# Patient Record
Sex: Male | Born: 1970 | Race: White | Hispanic: No | Marital: Married | Smoking: Former smoker
Health system: Southern US, Community
[De-identification: ages and names within clinical notes are randomized; demographics above are authoritative.]

## PROBLEM LIST (undated history)

## (undated) DIAGNOSIS — Z8042 Family history of malignant neoplasm of prostate: Secondary | ICD-10-CM

## (undated) DIAGNOSIS — Z860100 Personal history of colon polyps, unspecified: Secondary | ICD-10-CM

## (undated) DIAGNOSIS — K4090 Unilateral inguinal hernia, without obstruction or gangrene, not specified as recurrent: Secondary | ICD-10-CM

## (undated) DIAGNOSIS — J301 Allergic rhinitis due to pollen: Secondary | ICD-10-CM

## (undated) DIAGNOSIS — Z8051 Family history of malignant neoplasm of kidney: Secondary | ICD-10-CM

## (undated) DIAGNOSIS — Z808 Family history of malignant neoplasm of other organs or systems: Secondary | ICD-10-CM

## (undated) DIAGNOSIS — T7840XA Allergy, unspecified, initial encounter: Secondary | ICD-10-CM

## (undated) DIAGNOSIS — Z800 Family history of malignant neoplasm of digestive organs: Secondary | ICD-10-CM

## (undated) DIAGNOSIS — T8859XA Other complications of anesthesia, initial encounter: Secondary | ICD-10-CM

## (undated) DIAGNOSIS — C6290 Malignant neoplasm of unspecified testis, unspecified whether descended or undescended: Secondary | ICD-10-CM

## (undated) DIAGNOSIS — C800 Disseminated malignant neoplasm, unspecified: Secondary | ICD-10-CM

## (undated) DIAGNOSIS — C439 Malignant melanoma of skin, unspecified: Secondary | ICD-10-CM

## (undated) HISTORY — PX: MOHS SURGERY: SUR867

## (undated) HISTORY — PX: INGUINAL HERNIA REPAIR: SUR1180

## (undated) HISTORY — DX: Malignant neoplasm of unspecified testis, unspecified whether descended or undescended: C62.90

## (undated) HISTORY — DX: Family history of malignant neoplasm of digestive organs: Z80.0

## (undated) HISTORY — DX: Malignant melanoma of skin, unspecified: C43.9

## (undated) HISTORY — DX: Family history of malignant neoplasm of prostate: Z80.42

## (undated) HISTORY — DX: Disseminated malignant neoplasm, unspecified: C80.0

## (undated) HISTORY — DX: Allergic rhinitis due to pollen: J30.1

## (undated) HISTORY — DX: Allergy, unspecified, initial encounter: T78.40XA

## (undated) HISTORY — PX: MELANOMA EXCISION: SHX5266

## (undated) HISTORY — DX: Family history of malignant neoplasm of kidney: Z80.51

## (undated) HISTORY — DX: Family history of malignant neoplasm of other organs or systems: Z80.8

## (undated) HISTORY — PX: SURGERY SCROTAL / TESTICULAR: SUR1316

---
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
## 2001-03-31 DIAGNOSIS — C6290 Malignant neoplasm of unspecified testis, unspecified whether descended or undescended: Secondary | ICD-10-CM

## 2001-03-31 HISTORY — DX: Malignant neoplasm of unspecified testis, unspecified whether descended or undescended: C62.90

## 2001-03-31 HISTORY — PX: ORCHIECTOMY: SHX2116

## 2010-01-24 NOTE — Patient Instructions (Signed)
Sciatica: After Your Visit  Your Care Instructions  Sciatica (say "sye-AT-ih-kuh") is an irritation of one of the sciatic nerves, which come from the spinal cord in the lower back. The sciatic nerves and their branches extend down through the buttock to the foot. Sciatica can develop when an injured disc in the back presses against a spinal nerve root. Its main symptom is pain, numbness, or weakness that is often worse in the leg or foot than in the back.  Sciatica often will improve and go away with time. Early treatment usually includes medicines and exercises to relieve pain.  Follow-up care is a key part of your treatment and safety. Be sure to make and go to all appointments, and call your doctor if you are having problems. It???s also a good idea to know your test results and keep a list of the medicines you take.  How can you care for yourself at home?  ?? Take pain medicines exactly as directed.   ?? If the doctor gave you a prescription medicine for pain, take it as prescribed.   ?? If you are not taking a prescription pain medicine, ask your doctor if you can take an over-the-counter medicine.  ?? Put ice or a cold pack on the middle of your lower back for 10 to 20 minutes several times each day. Put a thin cloth between the ice and your skin.   ?? After the first 2 or 3 days, use a warm pack or heating pad for 20 minutes at a time. Hot showers will also help. Hot baths may help as long as you can lie or sit in a position that does not stress your back. You may also keep using ice if it helps.   ?? Avoid sitting if possible, unless it feels better than standing.   ?? Alternate lying down with short walks. Increase your walking distance as you are able to without making your symptoms worse.   ?? Do not do anything that makes your symptoms worse.  When should you call for help?  Call 911 anytime you think you may need emergency care. For example, call if:  ?? You have sudden weakness or numbness in both legs.    ?? You lose bowel or bladder control.   ?? You suddenly cannot walk or stand.  Call your doctor now or seek immediate medical care if:  ?? You have weakness in your ankle or leg.   ?? You have new pain, numbness, tingling, or weakness, especially in the buttocks, genital or rectal area, legs, or feet.   ?? You have symptoms of a urinary infection. For example:   ?? You have blood or pus in your urine.   ?? You have pain in your back just below your rib cage. This is called flank pain.   ?? You have a fever, chills, or body aches.   ?? It hurts to urinate.   ?? You have groin or belly pain.  Watch closely for changes in your health, and be sure to contact your doctor if:  ?? Your back pain gets worse or more frequent.   ?? Your back pain is not getting better after 1 week of home treatment. It may take a lot longer for the pain to go away completely, but it should feel at least a little better.     Where can you learn more?   Go to http://www.healthwise.net/[facility]  Enter Z239 in the search box to learn more about "  Sciatica: After Your Visit."   ?? 2006-2011 [employer]. Care instructions adapted under license by [employer] (which disclaims liability or warranty for this information). This care instruction is for use with your licensed healthcare professional. If you have questions about a medical condition or this instruction, always ask your healthcare professional. Healthwise, Incorporated disclaims any warranty or liability for your use of this information.  Content Version: 9.0.56994; Last Revised: July 06, 2009

## 2010-01-24 NOTE — Progress Notes (Signed)
HISTORY OF PRESENT ILLNESS  Kevin Riley is a 39 y.o. male.  HPI    new to my practice        Problem visit:  Kevin Riley is here for complaint of R lower buttock to posterior thigh.  Problem began 4 month(s) ago.  Severity is moderate  Character of problem: worse with standing or sitting for long periods.  running makes the problem worse.  aleve makes the problem better.  Associated symptoms include: none  Treatments tried include: Aleve used and beneficial    Recent full labs with work wellness [facility].  All look normal.    Patient Active Problem List   Diagnoses Code   ??? Testicular cancer 186.61F       Past Surgical History   Procedure Date   ??? Hx orchiectomy      Right   ??? Hx appendectomy    ??? Hx hernia repair      inguinal right       History   Social History   ??? Marital Status: Married     Spouse Name: N/A     Number of Children: N/A   ??? Years of Education: N/A   Occupational History   ??? Not on file.   Social History Main Topics   ??? Smoking status: Former Smoker -- 0.5 packs/day for 8 years     Quit date: 01/24/2002   ??? Smokeless tobacco: Never Used   ??? Alcohol Use: 4.0 oz/week     8 Cans of beer per week   ??? Drug Use: No   ??? Sexually Active: Yes -- Male partner(s)   Other Topics Concern   ??? Not on file   Social History Narrative   ??? No narrative on file       Family History   Problem Relation Age of Onset   ??? COPD Mother    ??? Colon Polyps Father    ??? Hypertension Father    ??? High Cholesterol Father    ??? Cancer Paternal Grandmother 57     colon   ??? Diabetes Neg Hx        Current outpatient prescriptions   Medication Sig   ??? NAPROXEN SODIUM (ALEVE PO) Take  by mouth.   ??? omega-3 fatty acids-vitamin e (FISH OIL) 1,000 mg Cap Take 1 Cap by mouth.       No Known Allergies      Review of Systems   Constitutional: Negative for weight loss and malaise/fatigue.   HENT: Negative for sore throat.    Eyes: Negative for blurred vision and pain.   Respiratory: Negative for cough, shortness of breath and wheezing.     Cardiovascular: Negative for chest pain, palpitations and leg swelling.   Gastrointestinal: Negative for heartburn, nausea, vomiting, diarrhea and constipation.   Genitourinary: Positive for urgency (has had urgency for years since chemo.  multiple negative workups) and frequency. Negative for dysuria and hematuria.   Musculoskeletal: Negative for myalgias, back pain and joint pain.   Skin: Negative for rash.   Neurological: Negative for dizziness and headaches.   Psychiatric/Behavioral: Negative for depression. The patient is not nervous/anxious.        Physical Exam   Nursing note and vitals reviewed.  Constitutional: He appears well-developed and well-nourished. No distress.        BP 126/75   Pulse 46   Temp 114.8 ??F (46 ??C)   Ht 5' 11.5" (1.816 m)   Wt 195 lb 9.6 oz (  88.724 kg)   BMI 26.90 kg/m2Body mass index is 26.90 kg/(m^2).   HENT:   Mouth/Throat: Oropharynx is clear and moist.   Neck: No JVD present. Carotid bruit is not present.   Cardiovascular: Normal rate, regular rhythm, normal heart sounds and intact distal pulses.    Pulmonary/Chest: Effort normal and breath sounds normal.   Musculoskeletal: He exhibits no edema.        Lumbar back: He exhibits normal range of motion, no tenderness, no swelling, no pain and no spasm.        straight leg-raise negative on R   Neurological: He is alert.   Skin: Skin is warm and dry. He is not diaphoretic.        Multiple regular pigmented nevi, cherry hemangiomas, skin tags, pink papules - over trunk and face and UE's..  None suspicitous.  He believes some are enlarging.       ASSESSMENT and PLAN  Aubert was seen today for establish care and hip pain.    Diagnoses and associated orders for this visit:    Sciatica  - NAPROXEN SODIUM (ALEVE PO); Take  by mouth.  The patient was given written instructions detailing his diagnoses and recommendations made today at the visit.    Testicular cancer - follow up with oncology yearly.    Skin lesions, generalized   - REFERRAL TO DERMATOLOGY    Other Orders  - omega-3 fatty acids-vitamin e (FISH OIL) 1,000 mg Cap; Take 1 Cap by mouth.  - ascorbic acid (VITAMIN C) 1,000 mg tablet; Take  by mouth.  - MULTIVITAMIN/IRON/FOLIC ACID (CENTRUM COMPLETE PO); Take  by mouth.        Follow-up Disposition:  Return if symptoms worsen or fail to improve.  lab results and schedule of future lab studies reviewed with patient  reviewed medications and side effects in detail

## 2011-05-08 NOTE — Progress Notes (Signed)
Pt received Tdap today in the office. Tolerated well and recorded in pt's chart.

## 2011-05-08 NOTE — Progress Notes (Signed)
Dermatologist/early 2012/skin check/christine rausch
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
## 2012-03-31 DIAGNOSIS — C439 Malignant melanoma of skin, unspecified: Secondary | ICD-10-CM

## 2012-03-31 HISTORY — DX: Malignant melanoma of skin, unspecified: C43.9

## 2012-06-08 NOTE — Progress Notes (Signed)
HISTORY OF PRESENT ILLNESS  Kevin Riley is a 42 y.o. male.  HPI  Problem visit:  Kevin Riley is here for complaint of acute medial arch pain.  Problem began 1 week(s) ago.  Severity is marked  Character of problem: occurred while running in hard soled shoes  Standing/ walking makes the problem worse.  Rest, nonwt bearing makes the problem better.  Associated symptoms include: no swelling,  Has bruised under arch  Treatments tried include: Aleve used and beneficial      ROS    Physical Exam   Musculoskeletal:        Right ankle: Normal.        Feet:    Tender with area of ecchymosis where depicted.  No swelling or warmth.  Walking with limp.   BP 150/83   Pulse 65   Temp(Src) 98 ??F (36.7 ??C) (Oral)   Resp 22   Ht 5' 11.5" (1.816 m)   Wt 201 lb 9.6 oz (91.445 kg)   BMI 27.73 kg/m2   SpO2 100%      ASSESSMENT and PLAN  Kevin Riley was seen today for foot pain.    Diagnoses and associated orders for this visit:    Foot pain, right - ? Stress fracture vs soft tissue tear/injury.  Start with xray.  Podiatry consult.  - XR FOOT RT MIN 3 V; Future  - REFERRAL TO PODIATRY  Continue aleve.        Follow-up Disposition:  Return if symptoms worsen or fail to improve.  reviewed medications and side effects in detail  radiology results and schedule of future radiology studies reviewed with patient

## 2012-06-09 NOTE — Telephone Encounter (Signed)
Results are in pt's chart for foot xray. Will send message to pcp for review and advisement.

## 2012-06-09 NOTE — Telephone Encounter (Signed)
Please call pt regarding his xray results on his mobile. Thanks

## 2012-06-10 NOTE — Telephone Encounter (Signed)
xrays were normal and sent to him thru mychart system yesterday soon after study.  Please let him know.  Please continue with the plans we discussed at your office visit.

## 2012-06-10 NOTE — Telephone Encounter (Signed)
Spoke to pt who was made aware to call with any questions or concerns.

## 2013-06-02 LAB — AMB POC RAPID INFLUENZA TEST: QuickVue Influenza test: NEGATIVE

## 2013-06-02 MED ORDER — OSELTAMIVIR PHOSPHATE 75 MG CAP
75 mg | ORAL_CAPSULE | Freq: Two times a day (BID) | ORAL | Status: AC
Start: 2013-06-02 — End: 2013-06-07

## 2013-06-02 MED ORDER — CODEINE-GUAIFENESIN 10 MG-100 MG/5 ML ORAL LIQUID
100-10 mg/5 mL | Freq: Three times a day (TID) | ORAL | Status: AC | PRN
Start: 2013-06-02 — End: ?

## 2013-06-02 NOTE — Patient Instructions (Signed)
Influenza (Flu): After Your Visit  Your Care Instructions  Influenza (flu) is an infection in the lungs and breathing passages. It is caused by the influenza virus. There are different strains, or types, of the flu virus from year to year. Unlike the common cold, the flu comes on suddenly and the symptoms, such as a cough, congestion, fever, chills, fatigue, aches, and pains, are more severe. These symptoms may last up to 10 days. Although the flu can make you feel very sick, it usually doesn't cause serious health problems.  Home treatment is usually all you need for flu symptoms. But your doctor may prescribe antiviral medicine to prevent other health problems, such as pneumonia, from developing. Older people and those who have a long-term health condition, such as lung disease, are most at risk for having pneumonia or other health problems.  Follow-up care is a key part of your treatment and safety. Be sure to make and go to all appointments, and call your doctor if you are having problems. It???s also a good idea to know your test results and keep a list of the medicines you take.  How can you care for yourself at home?  ?? Get plenty of rest.  ?? Drink plenty of fluids, enough so that your urine is light yellow or clear like water. If you have kidney, heart, or liver disease and have to limit fluids, talk with your doctor before you increase the amount of fluids you drink.  ?? Take an over-the-counter pain medicine if needed, such as acetaminophen (Tylenol), ibuprofen (Advil, Motrin), or naproxen (Aleve), to relieve fever, headache, and muscle aches. Read and follow all instructions on the label. No one younger than 20 should take aspirin. It has been linked to Reye syndrome, a serious illness.  ?? Do not smoke. Smoking can make the flu worse. If you need help quitting, talk to your doctor about stop-smoking programs and medicines. These can increase your chances of quitting for good.  ?? Breathe moist air from a  hot shower or from a sink filled with hot water to help clear a stuffy nose.  ?? Before you use cough and cold medicines, check the label. These medicines may not be safe for young children or for people with certain health problems.  ?? If the skin around your nose and lips becomes sore, put some petroleum jelly on the area.  ?? To ease coughing:  ?? Drink fluids to soothe a scratchy throat.  ?? Suck on cough drops or plain hard candy.  ?? Take an over-the-counter cough medicine that contains dextromethorphan to help you get some sleep. Read and follow all instructions on the label.  ?? Raise your head at night with an extra pillow. This may help you rest if coughing keeps you awake.  ?? Take any prescribed medicine exactly as directed. Call your doctor if you think you are having a problem with your medicine.  To avoid spreading the flu  ?? Wash your hands regularly, and keep your hands away from your face.  ?? Stay home from school, work, and other public places until you are feeling better and your fever has been gone for at least 24 hours. The fever needs to have gone away on its own without the help of medicine.  ?? Ask people living with you to talk to their doctors about preventing the flu. They may get antiviral medicine to keep from getting the flu from you.  ?? To prevent the flu in   the future, get a flu vaccine every fall. Encourage people living with you to get the vaccine.  ?? Cover your mouth when you cough or sneeze.  When should you call for help?  Call 911 anytime you think you may need emergency care. For example, call if:  ?? You have severe trouble breathing.  Call your doctor now or seek immediate medical care if:  ?? You have new or worse trouble breathing.  ?? You seem to be getting much sicker.  ?? You feel very sleepy or confused.  ?? You have a new or higher fever.  ?? You get a new rash.  Watch closely for changes in your health, and be sure to contact your doctor if:  ?? You begin to get better and then get  worse.  ?? You are not getting better after 1 week.   Where can you learn more?   Go to http://www.healthwise.net/[facility]  Enter L652 in the search box to learn more about "Influenza (Flu): After Your Visit."   ?? 2006-2014 Healthwise, Inc[employer]. Care instructions adapted under license by [place] (which disclaims liability or warranty for this information). This care instruction is for use with your licensed healthcare professional. If you have questions about a medical condition or this instruction, always ask your healthcare professional. Healthwise, Incorporated disclaims any warranty or liability for your use of this information.  Content Version: 10.2.346038; Current as of: November 04, 2011

## 2013-06-02 NOTE — Progress Notes (Signed)
Kevin Riley is a 43 y.o. male who complains of flu-like illness for 2 days    Symptoms include dry cough for 2 days, now congestion, sneezing, myalgias, headache, fever and chills for 1 day Symptoms have been worsening since onset.  he denies diarrhea  Denies a history of chest pain, nausea, shortness of breath, vomiting and wheezing.  Evaluation to date: none.   Treatment to date: OTC products.   reports that he quit smoking about 11 years ago. He has never used smokeless tobacco.  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  Worked w him all day 2-3 days ago    Physical Exam   Nursing note and vitals reviewed.   Blood pressure 126/80, pulse 58, temperature 100.2 ??F (37.9 ??C), resp. rate 12, height 5' 11.5" (1.816 m), weight 163 lb 12.8 oz (74.299 kg), SpO2 98 %.  Constitutional:  Pt is oriented. Appears fatigued, not diaphoretic. No distress  Right Ear: Tympanic membrane and ear canal normal.   Left Ear: Tympanic membrane and ear canal normal.   Nose: Mucosal edema and rhinorrhea present.   Mouth/Throat: Mucous membranes w mild erythema. No oropharyngeal exudate or posterior oropharyngeal erythema.   Eyes: Right eye exhibits no discharge. Left eye exhibits no discharge.   Cardiovascular: Normal rate and regular rhythm. No murmur heard.   Pulmonary/Chest: No stridor. No wheezing   Abdominal: Bowel sounds are normal. No tenderness.   Musculoskeletal: She exhibits no edema.   Lymphadenopathy:   Head (right side): No submandibular and no preauricular adenopathy present.   Head (left side): No submandibular and no preauricular adenopathy present.   She has no cervical adenopathy.   Neurological: She is alert and oriented.   Skin: No rash noted. She is not diaphoretic.   Psychiatric: She has a normal mood and affect. Thought content normal.    Influenza- presumed.  Test negative, but exposure to flu and s/s  No evidence of  pneumonia.     Timing of illness and/or concomitant medical conditions do warrant Tamiflu therapy per CDC guidelines.     Seek medical care if s/s worsen after 3-4 days or if develop chest pain, shortness of breath, confusion.     - Rest as much as possible and stay home from work/school              - Wash hand frequently and cough/sneeze into your sleeve to help prevent       infection of others   - Drink plenty of fluids   - Ibuprofen (Advil, Motrin) 400-800mg  every 6 hours or Aleve 220 mg every                   8 hours for fever, headache, pain   - Tylenol extra strength 500 mg every 6 hours for pain, headache, fever   - Nasal saline rinses 2-3 times daily for nasal congestion   - Mucinex DM, Robitussin DM or Delsym for cough   - Cepacol throat losenges and saline gargles (1 tsp salt in 8 oz water) for sore       throat   - Tea with honey for cough              - Benadryl (diphenhydramine) 50 mg at night for nasal or allergic symptoms   - Pseudophedrine 12-hour tablets twice daily for nasal and inner ear congestion.     - Afrin (  oxymetazoline) nasal spray 2 sprays in each nostril twice daily for severe      congestion.  Do not use this medication for more than 3 days as it may cause      "rebound congestion".

## 2015-09-06 DIAGNOSIS — D1801 Hemangioma of skin and subcutaneous tissue: Secondary | ICD-10-CM | POA: Diagnosis not present

## 2015-09-06 DIAGNOSIS — D235 Other benign neoplasm of skin of trunk: Secondary | ICD-10-CM | POA: Diagnosis not present

## 2015-09-06 DIAGNOSIS — L821 Other seborrheic keratosis: Secondary | ICD-10-CM | POA: Diagnosis not present

## 2015-09-06 DIAGNOSIS — L814 Other melanin hyperpigmentation: Secondary | ICD-10-CM | POA: Diagnosis not present

## 2015-10-04 ENCOUNTER — Ambulatory Visit (INDEPENDENT_AMBULATORY_CARE_PROVIDER_SITE_OTHER): Payer: BLUE CROSS/BLUE SHIELD | Admitting: Family Medicine

## 2015-10-04 ENCOUNTER — Encounter: Payer: Self-pay | Admitting: Family Medicine

## 2015-10-04 VITALS — BP 126/82 | HR 63 | Temp 97.8°F | Resp 16 | Ht 70.5 in | Wt 198.0 lb

## 2015-10-04 DIAGNOSIS — Z85820 Personal history of malignant melanoma of skin: Secondary | ICD-10-CM | POA: Diagnosis not present

## 2015-10-04 DIAGNOSIS — Z8547 Personal history of malignant neoplasm of testis: Secondary | ICD-10-CM

## 2015-10-04 DIAGNOSIS — Z8042 Family history of malignant neoplasm of prostate: Secondary | ICD-10-CM

## 2015-10-04 DIAGNOSIS — M7651 Patellar tendinitis, right knee: Secondary | ICD-10-CM | POA: Diagnosis not present

## 2015-10-04 NOTE — Progress Notes (Signed)
Office Note 10/04/2015  CC:  Chief Complaint  Patient presents with  . Establish Care  . Knee Pain    right    HPI:  Jay Ward is a 45 y.o. White male who is here to establish care and discuss knee pain. Patient's most recent primary MD: [facility], in [place], [place]. Old records were reviewed prior to or during today's visit.  Pt is an avid runner. Running outside more lately vs treadmill. Ran 10 miles one morning (he usually runs 3-5) and he had lots of knee cap pain and patellar tendon pain in R knee.  He took time off and it felt better but then he ran again and it came back.  So he rested again for longer and now recently started running 3-5 miles again and it is feeling fine now.  The knee did not swell, lock up, or buckle.    Has FH of colon ca: paternal aunt and paternal GM. Prostate ca in father in his 70s--pt describes it as "slow growing" and no treatment is being done.  Past Medical History  Diagnosis Date  . Testicular cancer [facility]) 2003    orchiectomy + chemo--he has been released from f/u by urology due to long term remission status.  . Melanoma ([place]) 2014    on his back.  He sees Derm q 6 mo.  . Family history of prostate cancer in father     Past Surgical History  Procedure Laterality Date  . Orchiectomy Left 2003  . Appendectomy  1990    Family History  Problem Relation Age of Onset  . Prostate cancer Father   . Hyperlipidemia Father   . Hypertension Father   . Colon cancer Paternal Aunt     dx in early 32's  . Heart disease Maternal Grandfather   . Stroke Maternal Grandfather   . Colon cancer Paternal Grandmother     Dx in her 15's    Social History   Social History  . Marital Status: Married    Spouse Name: N/A  . Number of Children: N/A  . Years of Education: N/A   Occupational History  . Not on file.   Social History Main Topics  . Smoking status: Former Smoker -- 0.25 packs/day  for 10 years    Types: Cigarettes    Quit date: 03/31/2001  . Smokeless tobacco: Never Used  . Alcohol Use: 3.6 oz/week    6 Cans of beer per week  . Drug Use: No  . Sexual Activity: Not on file   Other Topics Concern  . Not on file   Social History Narrative   Married, no children.   Orig from [place].   Educ: BS   Occup: Banking   Minimal tob use in the past--quit 2003.   Alc: rare.    MEDS: none  No Known Allergies  ROS Review of Systems  Constitutional: Negative for fever, chills, appetite change and fatigue.  HENT: Negative for congestion, dental problem, ear pain and sore throat.   Eyes: Negative for discharge, redness and visual disturbance.  Respiratory: Negative for cough, chest tightness, shortness of breath and wheezing.   Cardiovascular: Negative for chest pain, palpitations and leg swelling.  Gastrointestinal: Negative for nausea, vomiting, abdominal pain, diarrhea and blood in stool.  Genitourinary: Negative for dysuria, urgency, frequency, hematuria, flank pain and difficulty urinating.  Musculoskeletal: Negative for myalgias, back pain, joint swelling, arthralgias and neck stiffness.  Skin: Negative for pallor and rash.  Neurological: Negative for dizziness, speech difficulty, weakness and headaches.  Hematological: Negative for adenopathy. Does not bruise/bleed easily.  Psychiatric/Behavioral: Negative for confusion and sleep disturbance. The patient is not nervous/anxious.     PE; Blood pressure 126/82, pulse 63, temperature 97.8 F (36.6 C), temperature source Oral, resp. rate 16, height 5' 10.5" (1.791 m), weight 198 lb (89.812 kg), SpO2 97 %. Gen: Alert, well appearing.  Patient is oriented to person, place, time, and situation. AFFECT: pleasant, lucid thought and speech. [id number]: no injection, icteris, swelling, or exudate.  EOMI, PERRLA. Mouth: lips without lesion/swelling.  Oral mucosa pink and moist. Oropharynx without erythema, exudate, or  swelling.  Neck - No masses or thyromegaly or limitation in range of motion CV: RRR, no m/r/g.   LUNGS: CTA bilat, nonlabored resps, good aeration in all lung fields. EXT: no clubbing, cyanosis, or edema.  Right knee: full ROM without pain or stiffness or locking/clicking.  No significant areas of tenderness anywhere on the knee, but pt points to proximal aspect of patellar tendon and distal/lateral aspect of patella as the area where he was hurting.  Patellar grind neg.  No joint line tenderness.  McMurray's neg.  Lachman's neg.  No laxity or pain with varus/valgus stress.  Pertinent labs:  None today  I reviewed his old labs via his employer from 01/2014 and 01/2015: these were health panel labs that were all normal.  ASSESSMENT AND PLAN:   New pt;  1) Right knee patellar tendonitis, resolved. Reassured.  Discussed method of gradual increase in running distance rather than jumping from 5 miles to 10 miles.    2) Hx of testicular cancer: pt has been released from urologic f/u due to long term remission status.  3) Hx of melanoma: pt gets q43mo derm exams with Dr. Allyson Sabal.  4) FH of prostate cancer (father): at pt's next CPE, we'll get a baseline DRE and PSA.  5) FH of colon cancer: not first degree relative but 2 relatives involved: I think I would recommend he see the gastroenterologist for consult regarding early colonoscopy around age 48.  An After Visit Summary was printed and given to the patient.  Return in about 1 year (around 10/03/2016) for annual CPE (fasting).  Signed:  Crissie Sickles, MD           10/04/2015

## 2015-10-04 NOTE — Progress Notes (Signed)
Pre visit review using our clinic review tool, if applicable. No additional management support is needed unless otherwise documented below in the visit note. 

## 2016-01-23 DIAGNOSIS — Z23 Encounter for immunization: Secondary | ICD-10-CM | POA: Diagnosis not present

## 2016-01-25 DIAGNOSIS — Z0000 Encounter for general adult medical examination without abnormal findings: Secondary | ICD-10-CM | POA: Diagnosis not present

## 2016-03-06 DIAGNOSIS — D485 Neoplasm of uncertain behavior of skin: Secondary | ICD-10-CM | POA: Diagnosis not present

## 2016-03-06 DIAGNOSIS — D225 Melanocytic nevi of trunk: Secondary | ICD-10-CM | POA: Diagnosis not present

## 2016-03-06 DIAGNOSIS — D2271 Melanocytic nevi of right lower limb, including hip: Secondary | ICD-10-CM | POA: Diagnosis not present

## 2016-03-06 DIAGNOSIS — L814 Other melanin hyperpigmentation: Secondary | ICD-10-CM | POA: Diagnosis not present

## 2016-03-06 DIAGNOSIS — Z85820 Personal history of malignant melanoma of skin: Secondary | ICD-10-CM | POA: Diagnosis not present

## 2016-03-12 ENCOUNTER — Encounter: Payer: Self-pay | Admitting: Family Medicine

## 2016-06-05 DIAGNOSIS — D485 Neoplasm of uncertain behavior of skin: Secondary | ICD-10-CM | POA: Diagnosis not present

## 2016-09-11 DIAGNOSIS — L821 Other seborrheic keratosis: Secondary | ICD-10-CM | POA: Diagnosis not present

## 2016-09-11 DIAGNOSIS — H1132 Conjunctival hemorrhage, left eye: Secondary | ICD-10-CM | POA: Diagnosis not present

## 2016-09-11 DIAGNOSIS — Z85820 Personal history of malignant melanoma of skin: Secondary | ICD-10-CM | POA: Diagnosis not present

## 2016-09-11 DIAGNOSIS — D225 Melanocytic nevi of trunk: Secondary | ICD-10-CM | POA: Diagnosis not present

## 2016-09-11 DIAGNOSIS — L814 Other melanin hyperpigmentation: Secondary | ICD-10-CM | POA: Diagnosis not present

## 2016-09-16 ENCOUNTER — Ambulatory Visit (HOSPITAL_COMMUNITY)
Admission: EM | Admit: 2016-09-16 | Discharge: 2016-09-16 | Discharge status: Absent without leave | Payer: BLUE CROSS/BLUE SHIELD

## 2016-10-08 ENCOUNTER — Ambulatory Visit (INDEPENDENT_AMBULATORY_CARE_PROVIDER_SITE_OTHER): Payer: BLUE CROSS/BLUE SHIELD | Admitting: Family Medicine

## 2016-10-08 ENCOUNTER — Encounter: Payer: Self-pay | Admitting: Family Medicine

## 2016-10-08 VITALS — BP 118/69 | HR 53 | Temp 98.0°F | Resp 16 | Ht 70.5 in | Wt 204.2 lb

## 2016-10-08 DIAGNOSIS — Z0000 Encounter for general adult medical examination without abnormal findings: Secondary | ICD-10-CM

## 2016-10-08 DIAGNOSIS — Z8042 Family history of malignant neoplasm of prostate: Secondary | ICD-10-CM

## 2016-10-08 LAB — CBC WITH DIFFERENTIAL/PLATELET
BASOS ABS: 0.1 10*3/uL (ref 0.0–0.1)
Basophils Relative: 1.4 % (ref 0.0–3.0)
EOS ABS: 0.1 10*3/uL (ref 0.0–0.7)
Eosinophils Relative: 2.8 % (ref 0.0–5.0)
HEMATOCRIT: 40.1 % (ref 39.0–52.0)
Hemoglobin: 13.7 g/dL (ref 13.0–17.0)
LYMPHS PCT: 38.5 % (ref 12.0–46.0)
Lymphs Abs: 1.7 10*3/uL (ref 0.7–4.0)
MCHC: 34.1 g/dL (ref 30.0–36.0)
MCV: 84.4 fl (ref 78.0–100.0)
MONOS PCT: 8.7 % (ref 3.0–12.0)
Monocytes Absolute: 0.4 10*3/uL (ref 0.1–1.0)
NEUTROS PCT: 48.6 % (ref 43.0–77.0)
Neutro Abs: 2.1 10*3/uL (ref 1.4–7.7)
Platelets: 229 10*3/uL (ref 150.0–400.0)
RBC: 4.75 Mil/uL (ref 4.22–5.81)
RDW: 14.8 % (ref 11.5–15.5)
WBC: 4.4 10*3/uL (ref 4.0–10.5)

## 2016-10-08 LAB — COMPREHENSIVE METABOLIC PANEL
ALBUMIN: 4.5 g/dL (ref 3.5–5.2)
ALK PHOS: 58 U/L (ref 39–117)
ALT: 15 U/L (ref 0–53)
AST: 19 U/L (ref 0–37)
BILIRUBIN TOTAL: 0.6 mg/dL (ref 0.2–1.2)
BUN: 15 mg/dL (ref 6–23)
CALCIUM: 9.6 mg/dL (ref 8.4–10.5)
CO2: 28 meq/L (ref 19–32)
CREATININE: 0.99 mg/dL (ref 0.40–1.50)
Chloride: 104 mEq/L (ref 96–112)
GFR: 87.31 mL/min (ref >60.00)
Glucose, Bld: 104 mg/dL — ABNORMAL HIGH (ref 70–99)
Potassium: 4.3 mEq/L (ref 3.5–5.1)
Sodium: 139 mEq/L (ref 135–145)
Total Protein: 7.1 g/dL (ref 6.0–8.3)

## 2016-10-08 LAB — LIPID PANEL
CHOL/HDL RATIO: 3
CHOLESTEROL: 222 mg/dL — AB (ref 0–200)
HDL: 68.9 mg/dL (ref >39.00)
LDL Cholesterol: 142 mg/dL — ABNORMAL HIGH (ref 0–99)
NonHDL: 152.74
TRIGLYCERIDES: 54 mg/dL (ref 0.0–149.0)
VLDL: 10.8 mg/dL (ref 0.0–40.0)

## 2016-10-08 LAB — PSA: PSA: 1.03 ng/mL (ref 0.10–4.00)

## 2016-10-08 LAB — TSH: TSH: 1.3 u[IU]/mL (ref 0.35–4.50)

## 2016-10-08 NOTE — Patient Instructions (Signed)

## 2016-10-08 NOTE — Progress Notes (Signed)
Office Note 10/08/2016  CC:  Chief Complaint  Patient presents with  . Annual Exam    Pt is fasting.     HPI:  Jay Ward is a 46 y.o. male who is here for annual health maintenance exam. No acute complaints.  Eyes; gets annual exam. Dental: preventatives UTD. Exercise: 3 days/week running and some walking with dog.   Past Medical History:  Diagnosis Date  . Family history of prostate cancer in father   . Melanoma ([place]) 2014   on his back.  Also R thigh with dysplastic nevus with moderate atypia 02/2016.  He sees Derm (Dr. Allyson Sabal) q 6 mo.  . Testicular cancer [facility]) 2003   orchiectomy + chemo--he has been released from f/u by urology due to long term remission status.    Past Surgical History:  Procedure Laterality Date  . APPENDECTOMY  1990  . ORCHIECTOMY Left 2003    Family History  Problem Relation Age of Onset  . Prostate cancer Father   . Hyperlipidemia Father   . Hypertension Father   . Colon cancer Paternal Aunt        dx in early 27's  . Heart disease Maternal Grandfather   . Stroke Maternal Grandfather   . Colon cancer Paternal Grandmother        Dx in her 61's    Social History   Social History  . Marital status: Married    Spouse name: N/A  . Number of children: N/A  . Years of education: N/A   Occupational History  . Not on file.   Social History Main Topics  . Smoking status: Former Smoker    Packs/day: 0.25    Years: 10.00    Types: Cigarettes    Quit date: 03/31/2001  . Smokeless tobacco: Never Used  . Alcohol use 3.6 oz/week    6 Cans of beer per week  . Drug use: No  . Sexual activity: Not on file   Other Topics Concern  . Not on file   Social History Narrative   Married, no children.   Orig from [place].   Educ: BS   Occup: Banking   Minimal tob use in the past--quit 2003.   Alc: rare.     No outpatient prescriptions prior to visit.   No facility-administered medications prior to visit.     No Known  Allergies  ROS Review of Systems  Constitutional: Negative for appetite change, chills, fatigue and fever.  HENT: Negative for congestion, dental problem, ear pain and sore throat.   Eyes: Negative for discharge, redness and visual disturbance.  Respiratory: Negative for cough, chest tightness, shortness of breath and wheezing.   Cardiovascular: Negative for chest pain, palpitations and leg swelling.  Gastrointestinal: Negative for abdominal pain, blood in stool, diarrhea, nausea and vomiting.  Genitourinary: Negative for difficulty urinating, dysuria, flank pain, frequency, hematuria and urgency.  Musculoskeletal: Negative for arthralgias, back pain, joint swelling, myalgias and neck stiffness.  Skin: Negative for pallor and rash.  Neurological: Negative for dizziness, speech difficulty, weakness and headaches.  Hematological: Negative for adenopathy. Does not bruise/bleed easily.  Psychiatric/Behavioral: Negative for confusion and sleep disturbance. The patient is not nervous/anxious.     PE; Blood pressure 118/69, pulse (!) 53, temperature 98 F (36.7 C), temperature source Oral, resp. rate 16, height 5' 10.5" (1.791 m), weight 204 lb 4 oz (92.6 kg), SpO2 99 %. Gen: Alert, well appearing.  Patient is oriented to person, place, time, and situation. AFFECT: pleasant, lucid  thought and speech. ENT: Ears: EACs clear, normal epithelium.  TMs with good light reflex and landmarks bilaterally.  Eyes: no injection, icteris, swelling, or exudate.  EOMI, PERRLA. Nose: no drainage or turbinate edema/swelling.  No injection or focal lesion.  Mouth: lips without lesion/swelling.  Oral mucosa pink and moist.  Dentition intact and without obvious caries or gingival swelling.  Oropharynx without erythema, exudate, or swelling.  Neck: supple/nontender.  No LAD, mass, or TM.  Carotid pulses 2+ bilaterally, without bruits. CV: RRR, no m/r/g.   LUNGS: CTA bilat, nonlabored resps, good aeration in all lung  fields. ABD: soft, NT, ND, BS normal.  No hepatospenomegaly or mass.  No bruits. EXT: no clubbing, cyanosis, or edema.  Musculoskeletal: no joint swelling, erythema, warmth, or tenderness.  ROM of all joints intact. Skin - no sores or suspicious lesions or rashes or color changes Rectal exam: negative without mass, lesions or tenderness, PROSTATE EXAM: smooth and symmetric without nodules or tenderness.   Pertinent labs:  none  ASSESSMENT AND PLAN:   Health maintenance exam: Reviewed age and gender appropriate health maintenance issues (prudent diet, regular exercise, health risks of tobacco and excessive alcohol, use of seatbelts, fire alarms in home, use of sunscreen).  Also reviewed age and gender appropriate health screening as well as vaccine recommendations. Vaccines: UTD. HP labs today.  Pt declined HIV today. Prostate ca screening: he is high risk due to [place] (father) of prostate ca--start DRE/PSA screening now. FH colon cancer but not in first degree relative (2 more distant relatives): recommend GI consult at age 60 yrs.  An After Visit Summary was printed and given to the patient.  FOLLOW UP:  Return in about 1 year (around 10/08/2017) for annual CPE (fasting).  Signed:  Crissie Sickles, MD           10/08/2016

## 2017-03-16 DIAGNOSIS — Z85820 Personal history of malignant melanoma of skin: Secondary | ICD-10-CM | POA: Diagnosis not present

## 2017-03-16 DIAGNOSIS — D225 Melanocytic nevi of trunk: Secondary | ICD-10-CM | POA: Diagnosis not present

## 2017-03-16 DIAGNOSIS — L814 Other melanin hyperpigmentation: Secondary | ICD-10-CM | POA: Diagnosis not present

## 2017-03-23 ENCOUNTER — Encounter: Payer: Self-pay | Admitting: Family Medicine

## 2017-05-21 ENCOUNTER — Ambulatory Visit: Payer: BLUE CROSS/BLUE SHIELD | Admitting: Family Medicine

## 2017-05-21 ENCOUNTER — Encounter: Payer: Self-pay | Admitting: Family Medicine

## 2017-05-21 VITALS — BP 126/86 | HR 58 | Temp 98.6°F | Resp 16 | Ht 70.5 in | Wt 203.5 lb

## 2017-05-21 DIAGNOSIS — J0100 Acute maxillary sinusitis, unspecified: Secondary | ICD-10-CM

## 2017-05-21 MED ORDER — AMOXICILLIN 875 MG PO TABS: 875.0000 mg | ORAL_TABLET | Freq: Two times a day (BID) | ORAL | 0 refills | Status: AC

## 2017-05-21 NOTE — Patient Instructions (Signed)
Use otc generic saline nasal spray 2-3 times per day to irrigate/moisturize your nasal passages.   

## 2017-05-21 NOTE — Progress Notes (Signed)
OFFICE VISIT  05/21/2017   CC:  Chief Complaint  Patient presents with  . URI   HPI:    Patient is a 47 y.o.  male who presents for respiratory complaints. Three weeks ago he had flu-like illness that it took about 2 weeks to recover from.   About 1 week ago nasal congestion/runny nose, head/sinus pressure, HA.  No fever, some cough--occ productive. No wheezing, CP, chest tightness, no SOB.  No n/v. NO body aches. Nothing is improved the last 1-2 days.  Past Medical History:  Diagnosis Date  . Family history of prostate cancer in father   . Melanoma ([place]) 2014   on his back.  Also R thigh with dysplastic nevus with moderate atypia 02/2016.  He sees Derm (Dr. Allyson Sabal) q 6 mo: as of 02/2017 derm f/u all good.  . Testicular cancer [facility]) 2003   orchiectomy + chemo--he has been released from f/u by urology due to long term remission status.    Past Surgical History:  Procedure Laterality Date  . APPENDECTOMY  1990  . ORCHIECTOMY Left 2003    No outpatient medications prior to visit.   No facility-administered medications prior to visit.     No Known Allergies  ROS As per HPI  PE: Blood pressure 126/86, pulse (!) 58, temperature 98.6 F (37 C), temperature source Oral, resp. rate 16, height 5' 10.5" (1.791 m), weight 203 lb 8 oz (92.3 kg), SpO2 99 %. VS: noted--normal. Gen: alert, NAD, NONTOXIC APPEARING. HEENT: eyes without injection, drainage, or swelling.  Ears: EACs clear, TMs with normal light reflex and landmarks.  Nose: Clear rhinorrhea, with some dried, crusty exudate adherent to mildly injected mucosa.  No purulent d/c.  No paranasal sinus TTP.  No facial swelling.  Throat and mouth without focal lesion.  No pharyngial swelling, erythema, or exudate.   Neck: supple, no LAD.   LUNGS: CTA bilat, nonlabored resps.   CV: RRR, no m/r/g. EXT: no c/c/e SKIN: no rash    LABS:  none  IMPRESSION AND PLAN:  Viral URI with slow resolution vs acute sinusitis. Will  do trial of amoxil 875mg  bid x 10d. Saline nasal spray recommended.  An After Visit Summary was printed and given to the patient.   FOLLOW UP: Return if symptoms worsen or fail to improve.  Signed:  Crissie Sickles, MD           05/21/2017

## 2017-07-02 DIAGNOSIS — Z0000 Encounter for general adult medical examination without abnormal findings: Secondary | ICD-10-CM | POA: Diagnosis not present

## 2017-09-14 DIAGNOSIS — Z85820 Personal history of malignant melanoma of skin: Secondary | ICD-10-CM | POA: Diagnosis not present

## 2017-09-14 DIAGNOSIS — L814 Other melanin hyperpigmentation: Secondary | ICD-10-CM | POA: Diagnosis not present

## 2017-09-14 DIAGNOSIS — D1801 Hemangioma of skin and subcutaneous tissue: Secondary | ICD-10-CM | POA: Diagnosis not present

## 2017-09-14 DIAGNOSIS — D225 Melanocytic nevi of trunk: Secondary | ICD-10-CM | POA: Diagnosis not present

## 2017-09-21 ENCOUNTER — Encounter: Payer: Self-pay | Admitting: Family Medicine

## 2017-10-09 ENCOUNTER — Encounter: Payer: BLUE CROSS/BLUE SHIELD | Admitting: Family Medicine

## 2017-10-14 ENCOUNTER — Ambulatory Visit (INDEPENDENT_AMBULATORY_CARE_PROVIDER_SITE_OTHER): Payer: BLUE CROSS/BLUE SHIELD | Admitting: Family Medicine

## 2017-10-14 ENCOUNTER — Encounter: Payer: Self-pay | Admitting: Family Medicine

## 2017-10-14 VITALS — BP 119/77 | HR 56 | Temp 98.2°F | Resp 16 | Ht 70.5 in | Wt 204.4 lb

## 2017-10-14 DIAGNOSIS — E663 Overweight: Secondary | ICD-10-CM | POA: Diagnosis not present

## 2017-10-14 DIAGNOSIS — Z0000 Encounter for general adult medical examination without abnormal findings: Secondary | ICD-10-CM | POA: Diagnosis not present

## 2017-10-14 DIAGNOSIS — Z125 Encounter for screening for malignant neoplasm of prostate: Secondary | ICD-10-CM

## 2017-10-14 DIAGNOSIS — Z8042 Family history of malignant neoplasm of prostate: Secondary | ICD-10-CM

## 2017-10-14 LAB — TSH: TSH: 2.07 u[IU]/mL (ref 0.35–4.50)

## 2017-10-14 LAB — COMPREHENSIVE METABOLIC PANEL
ALT: 21 U/L (ref 0–53)
AST: 26 U/L (ref 0–37)
Albumin: 4.5 g/dL (ref 3.5–5.2)
Alkaline Phosphatase: 64 U/L (ref 39–117)
BUN: 12 mg/dL (ref 6–23)
CHLORIDE: 105 meq/L (ref 96–112)
CO2: 28 mEq/L (ref 19–32)
CREATININE: 0.92 mg/dL (ref 0.40–1.50)
Calcium: 9.4 mg/dL (ref 8.4–10.5)
GFR: 94.58 mL/min (ref >60.00)
GLUCOSE: 93 mg/dL (ref 70–99)
Potassium: 4 mEq/L (ref 3.5–5.1)
Sodium: 141 mEq/L (ref 135–145)
Total Bilirubin: 0.5 mg/dL (ref 0.2–1.2)
Total Protein: 7.2 g/dL (ref 6.0–8.3)

## 2017-10-14 LAB — CBC WITH DIFFERENTIAL/PLATELET
BASOS ABS: 0.1 10*3/uL (ref 0.0–0.1)
Basophils Relative: 1.9 % (ref 0.0–3.0)
EOS PCT: 4.2 % (ref 0.0–5.0)
Eosinophils Absolute: 0.3 10*3/uL (ref 0.0–0.7)
HEMATOCRIT: 42.5 % (ref 39.0–52.0)
HEMOGLOBIN: 14.4 g/dL (ref 13.0–17.0)
LYMPHS PCT: 32.8 % (ref 12.0–46.0)
Lymphs Abs: 2 10*3/uL (ref 0.7–4.0)
MCHC: 33.9 g/dL (ref 30.0–36.0)
MCV: 85.5 fl (ref 78.0–100.0)
MONOS PCT: 7.6 % (ref 3.0–12.0)
Monocytes Absolute: 0.5 10*3/uL (ref 0.1–1.0)
Neutro Abs: 3.2 10*3/uL (ref 1.4–7.7)
Neutrophils Relative %: 53.5 % (ref 43.0–77.0)
Platelets: 275 10*3/uL (ref 150.0–400.0)
RBC: 4.96 Mil/uL (ref 4.22–5.81)
RDW: 14.6 % (ref 11.5–15.5)
WBC: 6 10*3/uL (ref 4.0–10.5)

## 2017-10-14 LAB — LIPID PANEL
CHOL/HDL RATIO: 3
Cholesterol: 230 mg/dL — ABNORMAL HIGH (ref 0–200)
HDL: 74.8 mg/dL (ref >39.00)
LDL Cholesterol: 141 mg/dL — ABNORMAL HIGH (ref 0–99)
NONHDL: 154.99
Triglycerides: 69 mg/dL (ref 0.0–149.0)
VLDL: 13.8 mg/dL (ref 0.0–40.0)

## 2017-10-14 LAB — PSA: PSA: 1.15 ng/mL (ref 0.10–4.00)

## 2017-10-14 NOTE — Progress Notes (Signed)
Office Note 10/14/2017  CC:  Chief Complaint  Patient presents with  . Annual Exam    Pt is fasting.     HPI:  Jay Ward is a 47 y.o. male who is here for annual health maintenance exam. Work has been crazy--[employer] merging with [employer]. Not running as much but still 3 days per week, 4-6 miles each time. Diet: overall healthy except for when on vacation. Dental preventatives UTD.  Feeling well.  Past Medical History:  Diagnosis Date  . Family history of prostate cancer in father   . Hay fever   . Melanoma ([place]) 2014   on his back.  Also R thigh with dysplastic nevus with moderate atypia 02/2016.  He sees Derm (Dr. Allyson Sabal) q 6 mo: as of 08/2017 derm f/u all good.  . Testicular cancer [facility]) 2003   orchiectomy + chemo--he has been released from f/u by urology due to long term remission status.    Past Surgical History:  Procedure Laterality Date  . APPENDECTOMY  1990  . ORCHIECTOMY Left 2003    Family History  Problem Relation Age of Onset  . Prostate cancer Father   . Hyperlipidemia Father   . Hypertension Father   . Colon cancer Paternal Aunt        dx in early 30's  . Heart disease Maternal Grandfather   . Stroke Maternal Grandfather   . Colon cancer Paternal Grandmother        Dx in her 52's    Social History   Socioeconomic History  . Marital status: Married    Spouse name: Not on file  . Number of children: Not on file  . Years of education: Not on file  . Highest education level: Not on file  Occupational History  . Not on file  Social Needs  . Financial resource strain: Not on file  . Food insecurity:    Worry: Not on file    Inability: Not on file  . Transportation needs:    Medical: Not on file    Non-medical: Not on file  Tobacco Use  . Smoking status: Former Smoker    Packs/day: 0.25    Years: 10.00    Pack years: 2.50    Types: Cigarettes    Last attempt to quit: 03/31/2001    Years since quitting: 16.5  . Smokeless tobacco:  Never Used  Substance and Sexual Activity  . Alcohol use: Yes    Alcohol/week: 3.6 oz    Types: 6 Cans of beer per week  . Drug use: No  . Sexual activity: Not on file  Lifestyle  . Physical activity:    Days per week: Not on file    Minutes per session: Not on file  . Stress: Not on file  Relationships  . Social connections:    Talks on phone: Not on file    Gets together: Not on file    Attends religious service: Not on file    Active member of club or organization: Not on file    Attends meetings of clubs or organizations: Not on file    Relationship status: Not on file  . Intimate partner violence:    Fear of current or ex partner: Not on file    Emotionally abused: Not on file    Physically abused: Not on file    Forced sexual activity: Not on file  Other Topics Concern  . Not on file  Social History Narrative   Married, no children.  Orig from [place].   Educ: BS   Occup: Banking   Minimal tob use in the past--quit 2003.   Alc: rare.     Outpatient Medications Prior to Visit  Medication Sig Dispense Refill  . cetirizine (ZYRTEC) 10 MG tablet Take 10 mg by mouth daily as needed for allergies.     No facility-administered medications prior to visit.     No Known Allergies  ROS Review of Systems  Constitutional: Negative for appetite change, chills, fatigue and fever.  HENT: Negative for congestion, dental problem, ear pain and sore throat.   Eyes: Negative for discharge, redness and visual disturbance.  Respiratory: Negative for cough, chest tightness, shortness of breath and wheezing.   Cardiovascular: Negative for chest pain, palpitations and leg swelling.  Gastrointestinal: Negative for abdominal pain, blood in stool, diarrhea, nausea and vomiting.  Genitourinary: Negative for difficulty urinating, dysuria, flank pain, frequency, hematuria and urgency.  Musculoskeletal: Negative for arthralgias, back pain, joint swelling, myalgias and neck stiffness.   Skin: Negative for pallor and rash.  Neurological: Negative for dizziness, speech difficulty, weakness and headaches.  Hematological: Negative for adenopathy. Does not bruise/bleed easily.  Psychiatric/Behavioral: Negative for confusion and sleep disturbance. The patient is not nervous/anxious.     PE; Blood pressure 119/77, pulse (!) 56, temperature 98.2 F (36.8 C), temperature source Oral, resp. rate 16, height 5' 10.5" (1.791 m), weight 204 lb 6 oz (92.7 kg), SpO2 98 %. Body mass index is 28.91 kg/m.  Gen: Alert, well appearing.  Patient is oriented to person, place, time, and situation. AFFECT: pleasant, lucid thought and speech. ENT: Ears: EACs clear, normal epithelium.  TMs with good light reflex and landmarks bilaterally.  Eyes: no injection, icteris, swelling, or exudate.  EOMI, PERRLA. Nose: no drainage or turbinate edema/swelling.  No injection or focal lesion.  Mouth: lips without lesion/swelling.  Oral mucosa pink and moist.  Dentition intact and without obvious caries or gingival swelling.  Oropharynx without erythema, exudate, or swelling.  Neck: supple/nontender.  No LAD, mass, or TM.  Carotid pulses 2+ bilaterally, without bruits. CV: Regular, HR 50-55 by my exam, no m/r/g.   LUNGS: CTA bilat, nonlabored resps, good aeration in all lung fields. ABD: soft, NT, ND, BS normal.  No hepatospenomegaly or mass.  No bruits. EXT: no clubbing, cyanosis, or edema.  Musculoskeletal: no joint swelling, erythema, warmth, or tenderness.  ROM of all joints intact. Skin - no sores or suspicious lesions or rashes or color changes Rectal exam: negative without mass, lesions or tenderness, PROSTATE EXAM: smooth and symmetric without nodules or tenderness.   Pertinent labs:  Lab Results  Component Value Date   TSH 1.30 10/08/2016   Lab Results  Component Value Date   WBC 4.4 10/08/2016   HGB 13.7 10/08/2016   HCT 40.1 10/08/2016   MCV 84.4 10/08/2016   PLT 229.0 10/08/2016   Lab  Results  Component Value Date   CREATININE 0.99 10/08/2016   BUN 15 10/08/2016   NA 139 10/08/2016   K 4.3 10/08/2016   CL 104 10/08/2016   CO2 28 10/08/2016   Lab Results  Component Value Date   ALT 15 10/08/2016   AST 19 10/08/2016   ALKPHOS 58 10/08/2016   BILITOT 0.6 10/08/2016   Lab Results  Component Value Date   CHOL 222 (H) 10/08/2016   Lab Results  Component Value Date   HDL 68.90 10/08/2016   Lab Results  Component Value Date   LDLCALC 142 (H) 10/08/2016  Lab Results  Component Value Date   TRIG 54.0 10/08/2016   Lab Results  Component Value Date   CHOLHDL 3 10/08/2016   Lab Results  Component Value Date   PSA 1.03 10/08/2016    ASSESSMENT AND PLAN:   Health maintenance exam: Reviewed age and gender appropriate health maintenance issues (prudent diet, regular exercise, health risks of tobacco and excessive alcohol, use of seatbelts, fire alarms in home, use of sunscreen).  Also reviewed age and gender appropriate health screening as well as vaccine recommendations. Vaccines: all UTD. Labs: fasting HP + PSA. Prostate ca screening: FH-->father -->continue annual screening at this time.  DRE normal today. Colon ca screening: average risk patient= start screening at age 7 yrs.  He has asymptomatic bradycardia which I think is a result of his excellent cardiovascular physical conditioning. Reassured pt: continue current exercise. Signs/symptoms to call or return for were reviewed and pt expressed understanding.  An After Visit Summary was printed and given to the patient.  FOLLOW UP:  Return in about 1 year (around 10/15/2018) for annual CPE (fasting).  Signed:  Crissie Sickles, MD           10/14/2017

## 2017-10-14 NOTE — Patient Instructions (Signed)

## 2017-11-05 ENCOUNTER — Encounter: Payer: Self-pay | Admitting: Family Medicine

## 2017-11-05 ENCOUNTER — Ambulatory Visit: Payer: BLUE CROSS/BLUE SHIELD | Admitting: Family Medicine

## 2017-11-05 VITALS — BP 112/66 | HR 53 | Resp 16 | Ht 70.5 in | Wt 202.0 lb

## 2017-11-05 DIAGNOSIS — B372 Candidiasis of skin and nail: Secondary | ICD-10-CM

## 2017-11-05 MED ORDER — NYSTATIN 100000 UNIT/GM EX CREA: 1.0000 "application " | TOPICAL_CREAM | Freq: Three times a day (TID) | CUTANEOUS | 1 refills | Status: DC

## 2017-11-05 NOTE — Progress Notes (Signed)
OFFICE VISIT  11/05/2017   CC:  Chief Complaint  Patient presents with  . Rash    groin area x 2.5 weeks   HPI:    Patient is a 47 y.o. Caucasian male who presents for rash on groin region. Onset about 3 wks ago, very itchy esp at night, on scrotum and between glut surfaces and around anus. Was using gold bond powder, helped itch. Zinc oxide cream no help. OTC hydrocort no better/no worse. Wearing boxers.   Past Medical History:  Diagnosis Date  . Family history of prostate cancer in father   . Hay fever   . Melanoma ([place]) 2014   on his back.  Also R thigh with dysplastic nevus with moderate atypia 02/2016.  He sees Derm (Dr. Allyson Sabal) q 6 mo: as of 08/2017 derm f/u all good.  . Testicular cancer [facility]) 2003   orchiectomy + chemo--he has been released from f/u by urology due to long term remission status.    Past Surgical History:  Procedure Laterality Date  . APPENDECTOMY  1990  . ORCHIECTOMY Left 2003    Outpatient Medications Prior to Visit  Medication Sig Dispense Refill  . hydrocortisone cream 1 % Apply 1 application topically 2 (two) times daily.    . cetirizine (ZYRTEC) 10 MG tablet Take 10 mg by mouth daily as needed for allergies.     No facility-administered medications prior to visit.     No Known Allergies  ROS As per HPI  PE: Blood pressure 112/66, pulse (!) 53, resp. rate 16, height 5' 10.5" (1.791 m), weight 202 lb (91.6 kg), SpO2 98 %. Gen: Alert, well appearing.  Patient is oriented to person, place, time, and situation. AFFECT: pleasant, lucid thought and speech. GU: mildly erythematous confluent macular rash in groin creases and on scrotum with satellite lesions present, same rash in gluteal fold/perianal region but a little more erythematous in this region.  LABS:    Chemistry      Component Value Date/Time   NA 141 10/14/2017 0918   K 4.0 10/14/2017 0918   CL 105 10/14/2017 0918   CO2 28 10/14/2017 0918   BUN 12 10/14/2017 0918   CREATININE 0.92 10/14/2017 0918      Component Value Date/Time   CALCIUM 9.4 10/14/2017 0918   ALKPHOS 64 10/14/2017 0918   AST 26 10/14/2017 0918   ALT 21 10/14/2017 0918   BILITOT 0.5 10/14/2017 0918       IMPRESSION AND PLAN:  Candidal dermatitis: nystatin cream tid x 2-3 wks. Zinc oxide application in between applications of nystatin.  An After Visit Summary was printed and given to the patient.  FOLLOW UP: Return if symptoms worsen or fail to improve.  Signed:  Crissie Sickles, MD           11/05/2017

## 2018-01-06 DIAGNOSIS — Z23 Encounter for immunization: Secondary | ICD-10-CM | POA: Diagnosis not present

## 2018-09-15 DIAGNOSIS — D1801 Hemangioma of skin and subcutaneous tissue: Secondary | ICD-10-CM | POA: Diagnosis not present

## 2018-09-15 DIAGNOSIS — L819 Disorder of pigmentation, unspecified: Secondary | ICD-10-CM | POA: Diagnosis not present

## 2018-09-15 DIAGNOSIS — D229 Melanocytic nevi, unspecified: Secondary | ICD-10-CM | POA: Diagnosis not present

## 2018-09-15 DIAGNOSIS — L821 Other seborrheic keratosis: Secondary | ICD-10-CM | POA: Diagnosis not present

## 2018-09-15 DIAGNOSIS — D485 Neoplasm of uncertain behavior of skin: Secondary | ICD-10-CM | POA: Diagnosis not present

## 2018-10-19 ENCOUNTER — Encounter: Payer: Self-pay | Admitting: Family Medicine

## 2018-10-19 ENCOUNTER — Ambulatory Visit (INDEPENDENT_AMBULATORY_CARE_PROVIDER_SITE_OTHER): Payer: BC Managed Care – PPO | Admitting: Family Medicine

## 2018-10-19 ENCOUNTER — Other Ambulatory Visit: Payer: Self-pay

## 2018-10-19 VITALS — BP 127/81 | HR 80 | Temp 97.9°F | Resp 16 | Ht 70.5 in | Wt 196.6 lb

## 2018-10-19 DIAGNOSIS — Z0000 Encounter for general adult medical examination without abnormal findings: Secondary | ICD-10-CM | POA: Diagnosis not present

## 2018-10-19 DIAGNOSIS — Z8042 Family history of malignant neoplasm of prostate: Secondary | ICD-10-CM

## 2018-10-19 DIAGNOSIS — E663 Overweight: Secondary | ICD-10-CM

## 2018-10-19 LAB — COMPREHENSIVE METABOLIC PANEL
ALT: 18 U/L (ref 0–53)
AST: 21 U/L (ref 0–37)
Albumin: 4.6 g/dL (ref 3.5–5.2)
Alkaline Phosphatase: 61 U/L (ref 39–117)
BUN: 14 mg/dL (ref 6–23)
CO2: 25 mEq/L (ref 19–32)
Calcium: 9.4 mg/dL (ref 8.4–10.5)
Chloride: 106 mEq/L (ref 96–112)
Creatinine, Ser: 0.9 mg/dL (ref 0.40–1.50)
GFR: 90.86 mL/min (ref >60.00)
Glucose, Bld: 99 mg/dL (ref 70–99)
Potassium: 3.9 mEq/L (ref 3.5–5.1)
Sodium: 140 mEq/L (ref 135–145)
Total Bilirubin: 0.6 mg/dL (ref 0.2–1.2)
Total Protein: 6.8 g/dL (ref 6.0–8.3)

## 2018-10-19 LAB — CBC WITH DIFFERENTIAL/PLATELET
Basophils Absolute: 0 10*3/uL (ref 0.0–0.1)
Basophils Relative: 1 % (ref 0.0–3.0)
Eosinophils Absolute: 0.2 10*3/uL (ref 0.0–0.7)
Eosinophils Relative: 3.6 % (ref 0.0–5.0)
HCT: 40.8 % (ref 39.0–52.0)
Hemoglobin: 13.8 g/dL (ref 13.0–17.0)
Lymphocytes Relative: 38.7 % (ref 12.0–46.0)
Lymphs Abs: 1.7 10*3/uL (ref 0.7–4.0)
MCHC: 33.7 g/dL (ref 30.0–36.0)
MCV: 84.5 fl (ref 78.0–100.0)
Monocytes Absolute: 0.4 10*3/uL (ref 0.1–1.0)
Monocytes Relative: 8.1 % (ref 3.0–12.0)
Neutro Abs: 2.2 10*3/uL (ref 1.4–7.7)
Neutrophils Relative %: 48.6 % (ref 43.0–77.0)
Platelets: 238 10*3/uL (ref 150.0–400.0)
RBC: 4.83 Mil/uL (ref 4.22–5.81)
RDW: 14.3 % (ref 11.5–15.5)
WBC: 4.5 10*3/uL (ref 4.0–10.5)

## 2018-10-19 LAB — LIPID PANEL
Cholesterol: 218 mg/dL — ABNORMAL HIGH (ref 0–200)
HDL: 66.8 mg/dL (ref >39.00)
LDL Cholesterol: 137 mg/dL — ABNORMAL HIGH (ref 0–99)
NonHDL: 151.05
Total CHOL/HDL Ratio: 3
Triglycerides: 71 mg/dL (ref 0.0–149.0)
VLDL: 14.2 mg/dL (ref 0.0–40.0)

## 2018-10-19 LAB — TSH: TSH: 1.4 u[IU]/mL (ref 0.35–4.50)

## 2018-10-19 LAB — PSA: PSA: 0.91 ng/mL (ref 0.10–4.00)

## 2018-10-19 NOTE — Progress Notes (Signed)
Office Note 10/19/2018  CC:  Chief Complaint  Patient presents with  . Annual Exam    pt is fasting    HPI:  Jay Ward is a 48 y.o. White male who is here for annual health maintenance exam. Doing well. Working from home. Running on treadmill daily.  Past Medical History:  Diagnosis Date  . Family history of prostate cancer in father   . Hay fever   . Melanoma ([facility]) 2014   on his back.  Also R thigh with dysplastic nevus with moderate atypia 02/2016.  He sees Derm (Dr. Allyson Sabal) q 6 mo: as of 08/2017 derm f/u [facility].  . Testicular cancer [facility]) 2003   orchiectomy + chemo--he has been released from f/u by urology due to long term remission status.    Past Surgical History:  Procedure Laterality Date  . APPENDECTOMY  1990  . ORCHIECTOMY Left 2003    Family History  Problem Relation Age of Onset  . Prostate cancer Father   . Hyperlipidemia Father   . Hypertension Father   . Colon cancer Paternal Aunt        dx in early 66's  . Heart disease Maternal Grandfather   . Stroke Maternal Grandfather   . Colon cancer Paternal Grandmother        Dx in her 53's    Social History   Socioeconomic History  . Marital status: Married    Spouse name: Not on file  . Number of children: Not on file  . Years of education: Not on file  . Highest education level: Not on file  Occupational History  . Not on file  Social Needs  . Financial resource strain: Not on file  . Food insecurity    Worry: Not on file    Inability: Not on file  . Transportation needs    Medical: Not on file    Non-medical: Not on file  Tobacco Use  . Smoking status: Former Smoker    Packs/day: 0.25    Years: 10.00    Pack years: 2.50    Types: Cigarettes    Quit date: 03/31/2001    Years since quitting: 17.5  . Smokeless tobacco: Never Used  Substance and Sexual Activity  . Alcohol use: Yes    Alcohol/week: 6.0 standard drinks    Types: 6 Cans of beer per week  . Drug use: No  . Sexual  activity: Not on file  Lifestyle  . Physical activity    Days per week: Not on file    Minutes per session: Not on file  . Stress: Not on file  Relationships  . Social Herbalist on phone: Not on file    Gets together: Not on file    Attends religious service: Not on file    Active member of club or organization: Not on file    Attends meetings of clubs or organizations: Not on file    Relationship status: Not on file  . Intimate partner violence    Fear of current or ex partner: Not on file    Emotionally abused: Not on file    Physically abused: Not on file    Forced sexual activity: Not on file  Other Topics Concern  . Not on file  Social History Narrative   Married, no children.   Orig from [place].   Educ: BS   Occup: Banking   Minimal tob use in the past--quit 2003.   Alc: rare.  MEDS: none currently.  No Known Allergies  ROS Review of Systems  Constitutional: Negative for appetite change, chills, fatigue and fever.  HENT: Negative for congestion, dental problem, ear pain and sore throat.   Eyes: Negative for discharge, redness and visual disturbance.  Respiratory: Negative for cough, chest tightness, shortness of breath and wheezing.   Cardiovascular: Negative for chest pain, palpitations and leg swelling.  Gastrointestinal: Negative for abdominal pain, blood in stool, diarrhea, nausea and vomiting.  Genitourinary: Negative for difficulty urinating, dysuria, flank pain, frequency, hematuria and urgency.  Musculoskeletal: Negative for arthralgias, back pain, joint swelling, myalgias and neck stiffness.  Skin: Negative for pallor and rash.  Neurological: Negative for dizziness, speech difficulty, weakness and headaches.  Hematological: Negative for adenopathy. Does not bruise/bleed easily.  Psychiatric/Behavioral: Negative for confusion and sleep disturbance. The patient is not nervous/anxious.     PE; Blood pressure 127/81, pulse 80, temperature  97.9 F (36.6 C), temperature source Temporal, resp. rate 16, height 5' 10.5" (1.791 m), weight 196 lb 9.6 oz (89.2 kg), SpO2 97 %. Body mass index is 27.81 kg/m.  Gen: Alert, well appearing.  Patient is oriented to person, place, time, and situation. AFFECT: pleasant, lucid thought and speech. ENT: Ears: EACs clear, normal epithelium.  TMs with good light reflex and landmarks bilaterally.  Eyes: no injection, icteris, swelling, or exudate.  EOMI, PERRLA. Nose: no drainage or turbinate edema/swelling.  No injection or focal lesion.  Mouth: lips without lesion/swelling.  Oral mucosa pink and moist.  Dentition intact and without obvious caries or gingival swelling.  Oropharynx without erythema, exudate, or swelling.  Neck: supple/nontender.  No LAD, mass, or TM.  Carotid pulses 2+ bilaterally, without bruits. CV: RRR, no m/r/g.   LUNGS: CTA bilat, nonlabored resps, good aeration in all lung fields. ABD: soft, NT, ND, BS normal.  No hepatospenomegaly or mass.  No bruits. EXT: no clubbing, cyanosis, or edema.  Musculoskeletal: no joint swelling, erythema, warmth, or tenderness.  ROM of all joints intact. Skin - no sores or suspicious lesions or rashes or color changes Rectal exam: negative without mass, lesions or tenderness, PROSTATE EXAM: smooth and symmetric without nodules or tenderness.   Pertinent labs:  Lab Results  Component Value Date   TSH 2.07 10/14/2017   Lab Results  Component Value Date   WBC 6.0 10/14/2017   HGB 14.4 10/14/2017   HCT 42.5 10/14/2017   MCV 85.5 10/14/2017   PLT 275.0 10/14/2017   Lab Results  Component Value Date   CREATININE 0.92 10/14/2017   BUN 12 10/14/2017   NA 141 10/14/2017   K 4.0 10/14/2017   CL 105 10/14/2017   CO2 28 10/14/2017   Lab Results  Component Value Date   ALT 21 10/14/2017   AST 26 10/14/2017   ALKPHOS 64 10/14/2017   BILITOT 0.5 10/14/2017   Lab Results  Component Value Date   CHOL 230 (H) 10/14/2017   Lab Results   Component Value Date   HDL 74.80 10/14/2017   Lab Results  Component Value Date   LDLCALC 141 (H) 10/14/2017   Lab Results  Component Value Date   TRIG 69.0 10/14/2017   Lab Results  Component Value Date   CHOLHDL 3 10/14/2017   Lab Results  Component Value Date   PSA 1.15 10/14/2017   PSA 1.03 10/08/2016    ASSESSMENT AND PLAN:   Health maintenance exam: Reviewed age and gender appropriate health maintenance issues (prudent diet, regular exercise, health risks of tobacco  and excessive alcohol, use of seatbelts, fire alarms in home, use of sunscreen).  Also reviewed age and gender appropriate health screening as well as vaccine recommendations. Vaccines: all utd. Labs: fasting HP + PSA. Prostate ca screening: +father with hx of prostate ca and we've started screening pt annually-->DRE normal today, PSA. Colon ca screening:  average risk patient= as per latest guidelines, start screening at 45-50 yrs of age. He is strongly considering getting early colon ca screening but wants to wait until next CPE in 1 yr to discuss this again.  An After Visit Summary was printed and given to the patient.  FOLLOW UP:  Return in about 1 year (around 10/19/2019) for annual CPE (fasting).  Signed:  Crissie Sickles, MD           10/19/2018

## 2018-10-19 NOTE — Patient Instructions (Signed)

## 2019-06-06 ENCOUNTER — Telehealth: Payer: Self-pay

## 2019-06-06 NOTE — Telephone Encounter (Signed)
Please advise, thanks.

## 2019-06-06 NOTE — Telephone Encounter (Signed)
This is not a big point to get stuck on. I advise that he get the vaccine when he has the option. Reassure.-thx

## 2019-06-06 NOTE — Telephone Encounter (Signed)
Patient wants to know if he falls under guidelines to receive COVID vaccine in Phase 4 on March 24th.  He has had cancer before but could not find any other details about whether he can get vaccine if he has had cancer before.  [email or web address].info/   Please advise patient. [phone]

## 2019-06-06 NOTE — Telephone Encounter (Signed)
MyChart message read.

## 2019-09-29 DIAGNOSIS — L821 Other seborrheic keratosis: Secondary | ICD-10-CM | POA: Diagnosis not present

## 2019-09-29 DIAGNOSIS — D485 Neoplasm of uncertain behavior of skin: Secondary | ICD-10-CM | POA: Diagnosis not present

## 2019-09-29 DIAGNOSIS — D1801 Hemangioma of skin and subcutaneous tissue: Secondary | ICD-10-CM | POA: Diagnosis not present

## 2019-09-29 DIAGNOSIS — D225 Melanocytic nevi of trunk: Secondary | ICD-10-CM | POA: Diagnosis not present

## 2019-09-29 DIAGNOSIS — C44311 Basal cell carcinoma of skin of nose: Secondary | ICD-10-CM | POA: Diagnosis not present

## 2019-10-12 DIAGNOSIS — C4491 Basal cell carcinoma of skin, unspecified: Secondary | ICD-10-CM

## 2019-10-12 HISTORY — DX: Basal cell carcinoma of skin, unspecified: C44.91

## 2019-10-26 ENCOUNTER — Encounter: Payer: Self-pay | Admitting: Family Medicine

## 2019-10-26 ENCOUNTER — Other Ambulatory Visit: Payer: Self-pay

## 2019-10-26 ENCOUNTER — Ambulatory Visit (INDEPENDENT_AMBULATORY_CARE_PROVIDER_SITE_OTHER): Payer: BC Managed Care – PPO | Admitting: Family Medicine

## 2019-10-26 VITALS — BP 142/81 | HR 52 | Temp 97.9°F | Resp 16 | Ht 70.5 in | Wt 180.6 lb

## 2019-10-26 DIAGNOSIS — Z125 Encounter for screening for malignant neoplasm of prostate: Secondary | ICD-10-CM | POA: Diagnosis not present

## 2019-10-26 DIAGNOSIS — Z8042 Family history of malignant neoplasm of prostate: Secondary | ICD-10-CM | POA: Diagnosis not present

## 2019-10-26 DIAGNOSIS — Z1211 Encounter for screening for malignant neoplasm of colon: Secondary | ICD-10-CM | POA: Diagnosis not present

## 2019-10-26 DIAGNOSIS — Z0000 Encounter for general adult medical examination without abnormal findings: Secondary | ICD-10-CM

## 2019-10-26 LAB — COMPREHENSIVE METABOLIC PANEL
ALT: 19 U/L (ref 0–53)
AST: 22 U/L (ref 0–37)
Albumin: 4.5 g/dL (ref 3.5–5.2)
Alkaline Phosphatase: 59 U/L (ref 39–117)
BUN: 12 mg/dL (ref 6–23)
CO2: 29 mEq/L (ref 19–32)
Calcium: 9.7 mg/dL (ref 8.4–10.5)
Chloride: 107 mEq/L (ref 96–112)
Creatinine, Ser: 0.83 mg/dL (ref 0.40–1.50)
GFR: 99.32 mL/min (ref >60.00)
Glucose, Bld: 88 mg/dL (ref 70–99)
Potassium: 3.8 mEq/L (ref 3.5–5.1)
Sodium: 141 mEq/L (ref 135–145)
Total Bilirubin: 0.9 mg/dL (ref 0.2–1.2)
Total Protein: 6.9 g/dL (ref 6.0–8.3)

## 2019-10-26 LAB — CBC WITH DIFFERENTIAL/PLATELET
Basophils Absolute: 0 10*3/uL (ref 0.0–0.1)
Basophils Relative: 1 % (ref 0.0–3.0)
Eosinophils Absolute: 0.1 10*3/uL (ref 0.0–0.7)
Eosinophils Relative: 1.7 % (ref 0.0–5.0)
HCT: 40.9 % (ref 39.0–52.0)
Hemoglobin: 13.6 g/dL (ref 13.0–17.0)
Lymphocytes Relative: 37 % (ref 12.0–46.0)
Lymphs Abs: 1.9 10*3/uL (ref 0.7–4.0)
MCHC: 33.3 g/dL (ref 30.0–36.0)
MCV: 88.2 fl (ref 78.0–100.0)
Monocytes Absolute: 0.4 10*3/uL (ref 0.1–1.0)
Monocytes Relative: 7.6 % (ref 3.0–12.0)
Neutro Abs: 2.7 10*3/uL (ref 1.4–7.7)
Neutrophils Relative %: 52.7 % (ref 43.0–77.0)
Platelets: 224 10*3/uL (ref 150.0–400.0)
RBC: 4.63 Mil/uL (ref 4.22–5.81)
RDW: 15.1 % (ref 11.5–15.5)
WBC: 5.1 10*3/uL (ref 4.0–10.5)

## 2019-10-26 LAB — LIPID PANEL
Cholesterol: 209 mg/dL — ABNORMAL HIGH (ref 0–200)
HDL: 75.7 mg/dL (ref >39.00)
LDL Cholesterol: 119 mg/dL — ABNORMAL HIGH (ref 0–99)
NonHDL: 133.74
Total CHOL/HDL Ratio: 3
Triglycerides: 76 mg/dL (ref 0.0–149.0)
VLDL: 15.2 mg/dL (ref 0.0–40.0)

## 2019-10-26 LAB — PSA: PSA: 0.98 ng/mL (ref 0.10–4.00)

## 2019-10-26 LAB — TSH: TSH: 1.66 u[IU]/mL (ref 0.35–4.50)

## 2019-10-26 NOTE — Patient Instructions (Signed)

## 2019-10-26 NOTE — Progress Notes (Signed)
Office Note 10/26/2019  CC:  Chief Complaint  Patient presents with  . Annual Exam    pt is fasting   HPI:  Jay Ward is a 49 y.o. White male who is here for annual health maintenance exam.  Working from home, enjoys it. Has not been eating til 1pm each day b/c has been trying to lose wt, exercising much better. The last 1 mo or so, having 2 BMs per morning, still solid but a bit narrower shape than his normal.  Occ [facility].   No change/additon of any OTC meds. No abd pain.   Pt very apprehensive about colon cancer b/c he has had testicular cancer and melanoma, FH of prostate ca.  Recent BCC L side of nose removed recently, has to return for Moh's.  Walking last evening got a bug in his eye, feels irritated this morning.       Past Medical History:  Diagnosis Date  . Basal cell carcinoma 10/12/2019  . Family history of prostate cancer in father   . Hay fever   . Melanoma ([place]) 2014   on his back.  Also R thigh with dysplastic nevus with moderate atypia 02/2016.  He sees Derm (Dr. Allyson Sabal) q 6 mo: as of 08/2017 derm f/u [facility].  . Testicular cancer [facility]) 2003   orchiectomy + chemo--he has been released from f/u by urology due to long term remission status.    Past Surgical History:  Procedure Laterality Date  . APPENDECTOMY  1990  . ORCHIECTOMY Left 2003    Family History  Problem Relation Age of Onset  . Prostate cancer Father   . Hyperlipidemia Father   . Hypertension Father   . Colon cancer Paternal Aunt        dx in early 67's  . Heart disease Maternal Grandfather   . Stroke Maternal Grandfather   . Colon cancer Paternal Grandmother        Dx in her 24's    Social History   Socioeconomic History  . Marital status: Married    Spouse name: Not on file  . Number of children: Not on file  . Years of education: Not on file  . Highest education level: Not on file  Occupational History  . Not on file  Tobacco Use  . Smoking status: Former Smoker     Packs/day: 0.25    Years: 10.00    Pack years: 2.50    Types: Cigarettes    Quit date: 03/31/2001    Years since quitting: 18.5  . Smokeless tobacco: Never Used  Substance and Sexual Activity  . Alcohol use: Yes    Alcohol/week: 6.0 standard drinks    Types: 6 Cans of beer per week  . Drug use: No  . Sexual activity: Not on file  Other Topics Concern  . Not on file  Social History Narrative   Married, no children.   Orig from [place].   Educ: BS   Occup: Banking   Minimal tob use in the past--quit 2003.   Alc: rare.    Social Determinants of Health   Financial Resource Strain:   . Difficulty of Paying Living Expenses:   Food Insecurity:   . Worried About Charity fundraiser in the Last Year:   . Arboriculturist in the Last Year:   Transportation Needs:   . Film/video editor (Medical):   Marland Kitchen Lack of Transportation (Non-Medical):   Physical Activity:   . Days of Exercise  per Week:   . Minutes of Exercise per Session:   Stress:   . Feeling of Stress :   Social Connections:   . Frequency of Communication with Friends and Family:   . Frequency of Social Gatherings with Friends and Family:   . Attends Religious Services:   . Active Member of Clubs or Organizations:   . Attends Archivist Meetings:   Marland Kitchen Marital Status:   Intimate Partner Violence:   . Fear of Current or Ex-Partner:   . Emotionally Abused:   Marland Kitchen Physically Abused:   . Sexually Abused:     Outpatient Medications Prior to Visit  Medication Sig Dispense Refill  . Ascorbic Acid (VITAMIN C PO) Take by mouth daily.    . cetirizine (ZYRTEC) 10 MG tablet Take 10 mg by mouth daily as needed for allergies. (Patient not taking: Reported on 10/26/2019)    . hydrocortisone cream 1 % Apply 1 application topically 2 (two) times daily. (Patient not taking: Reported on 10/26/2019)    . nystatin cream (MYCOSTATIN) Apply 1 application topically 3 (three) times daily. (Patient not taking: Reported on  10/19/2018) 30 g 1   No facility-administered medications prior to visit.    No Known Allergies  ROS Review of Systems  Constitutional: Negative for appetite change, chills, fatigue and fever.  HENT: Negative for congestion, dental problem, ear pain and sore throat.   Eyes: Negative for discharge, redness and visual disturbance.  Respiratory: Negative for cough, chest tightness, shortness of breath and wheezing.   Cardiovascular: Negative for chest pain, palpitations and leg swelling.  Gastrointestinal: Negative for abdominal pain, blood in stool, diarrhea, nausea and vomiting.  Genitourinary: Negative for difficulty urinating, dysuria, flank pain, frequency, hematuria and urgency.  Musculoskeletal: Negative for arthralgias, back pain, joint swelling, myalgias and neck stiffness.  Skin: Negative for pallor and rash.  Neurological: Negative for dizziness, speech difficulty, weakness and headaches.  Hematological: Negative for adenopathy. Does not bruise/bleed easily.  Psychiatric/Behavioral: Negative for confusion and sleep disturbance. The patient is not nervous/anxious.     PE; Vitals with BMI 10/26/2019 10/19/2018 11/05/2017  Height 5' 10.5" 5' 10.5" 5' 10.5"  Weight 180 lbs 10 oz 196 lbs 10 oz 202 lbs  BMI 25.54 22.0 25.42  Systolic 706 237 628  Diastolic 81 81 66  Pulse 52 80 53  O2 sat on RA today is 99% Gen: Alert, well appearing.  Patient is oriented to person, place, time, and situation. AFFECT: pleasant, lucid thought and speech. ENT: Ears: EACs clear, normal epithelium.  TMs with good light reflex and landmarks bilaterally.  Eyes: no injection, icteris, swelling, or exudate.  EOMI, PERRLA. Nose: no drainage or turbinate edema/swelling.  No injection or focal lesion.  Mouth: lips without lesion/swelling.  Oral mucosa pink and moist.  Dentition intact and without obvious caries or gingival swelling.  Oropharynx without erythema, exudate, or swelling.  Neck: supple/nontender.   No LAD, mass, or TM.  Carotid pulses 2+ bilaterally, without bruits. CV: RRR, no m/r/g.   LUNGS: CTA bilat, nonlabored resps, good aeration in all lung fields. ABD: soft, NT, ND, BS normal.  No hepatospenomegaly or mass.  No bruits. EXT: no clubbing, cyanosis, or edema.  Musculoskeletal: no joint swelling, erythema, warmth, or tenderness.  ROM of all joints intact. Skin - no sores or suspicious lesions or rashes or color changes Rectal exam: negative without mass, lesions or tenderness, PROSTATE EXAM: smooth and symmetric without nodules or tenderness.   Pertinent labs:  Lab Results  Component Value Date   TSH 1.40 10/19/2018   Lab Results  Component Value Date   WBC 4.5 10/19/2018   HGB 13.8 10/19/2018   HCT 40.8 10/19/2018   MCV 84.5 10/19/2018   PLT 238.0 10/19/2018   Lab Results  Component Value Date   CREATININE 0.90 10/19/2018   BUN 14 10/19/2018   NA 140 10/19/2018   K 3.9 10/19/2018   CL 106 10/19/2018   CO2 25 10/19/2018   Lab Results  Component Value Date   ALT 18 10/19/2018   AST 21 10/19/2018   ALKPHOS 61 10/19/2018   BILITOT 0.6 10/19/2018   Lab Results  Component Value Date   CHOL 218 (H) 10/19/2018   Lab Results  Component Value Date   HDL 66.80 10/19/2018   Lab Results  Component Value Date   LDLCALC 137 (H) 10/19/2018   Lab Results  Component Value Date   TRIG 71.0 10/19/2018   Lab Results  Component Value Date   CHOLHDL 3 10/19/2018   Lab Results  Component Value Date   PSA 0.91 10/19/2018   PSA 1.15 10/14/2017   PSA 1.03 10/08/2016    ASSESSMENT AND PLAN:   Health maintenance exam: Reviewed age and gender appropriate health maintenance issues (prudent diet, regular exercise, health risks of tobacco and excessive alcohol, use of seatbelts, fire alarms in home, use of sunscreen).  Also reviewed age and gender appropriate health screening as well as vaccine recommendations. Vaccines: Tdap UTD.  Covid 19->UTD. Labs:  Fasting HP  labs + PSA ordered. Prostate ca screening: + FH of prost ca in father--> DRE normal, PSA. Colon ca screening: per latest guidelines he is due to start colon ca screening->referred today.  Reassured pt regarding his change in bowel habits the last month or so, nothing alarming: watchful waiting approach.  An After Visit Summary was printed and given to the patient.  FOLLOW UP:  Return in about 1 year (around 10/25/2020) for annual CPE (fasting).  Signed:  Crissie Sickles, MD           10/26/2019

## 2019-11-03 ENCOUNTER — Encounter: Payer: Self-pay | Admitting: Gastroenterology

## 2019-11-15 DIAGNOSIS — C44311 Basal cell carcinoma of skin of nose: Secondary | ICD-10-CM | POA: Diagnosis not present

## 2019-12-13 ENCOUNTER — Encounter: Payer: Self-pay | Admitting: Gastroenterology

## 2019-12-13 ENCOUNTER — Other Ambulatory Visit: Payer: Self-pay

## 2019-12-13 ENCOUNTER — Ambulatory Visit (AMBULATORY_SURGERY_CENTER): Payer: Self-pay

## 2019-12-13 VITALS — Ht 70.5 in | Wt 182.8 lb

## 2019-12-13 DIAGNOSIS — Z1211 Encounter for screening for malignant neoplasm of colon: Secondary | ICD-10-CM

## 2019-12-13 MED ORDER — SUTAB 1479-225-188 MG PO TABS: 12.0000 | ORAL_TABLET | ORAL | 0 refills | Status: DC

## 2019-12-13 NOTE — Progress Notes (Signed)
No allergies to soy or egg Pt is not on blood thinners or diet pills Denies issues with sedation/intubation Denies atrial flutter/fib Denies constipation   Pt is aware of Covid safety and care partner requirements.      

## 2019-12-30 ENCOUNTER — Ambulatory Visit (AMBULATORY_SURGERY_CENTER): Payer: BC Managed Care – PPO | Admitting: Gastroenterology

## 2019-12-30 ENCOUNTER — Other Ambulatory Visit: Payer: Self-pay

## 2019-12-30 ENCOUNTER — Encounter: Payer: Self-pay | Admitting: Gastroenterology

## 2019-12-30 VITALS — BP 117/75 | HR 43 | Temp 98.9°F | Resp 19 | Ht 70.5 in | Wt 182.8 lb

## 2019-12-30 DIAGNOSIS — D122 Benign neoplasm of ascending colon: Secondary | ICD-10-CM

## 2019-12-30 DIAGNOSIS — D129 Benign neoplasm of anus and anal canal: Secondary | ICD-10-CM | POA: Diagnosis not present

## 2019-12-30 DIAGNOSIS — D125 Benign neoplasm of sigmoid colon: Secondary | ICD-10-CM | POA: Diagnosis not present

## 2019-12-30 DIAGNOSIS — D124 Benign neoplasm of descending colon: Secondary | ICD-10-CM

## 2019-12-30 DIAGNOSIS — Z1211 Encounter for screening for malignant neoplasm of colon: Secondary | ICD-10-CM | POA: Diagnosis not present

## 2019-12-30 DIAGNOSIS — D120 Benign neoplasm of cecum: Secondary | ICD-10-CM | POA: Diagnosis not present

## 2019-12-30 DIAGNOSIS — D127 Benign neoplasm of rectosigmoid junction: Secondary | ICD-10-CM

## 2019-12-30 DIAGNOSIS — D123 Benign neoplasm of transverse colon: Secondary | ICD-10-CM

## 2019-12-30 DIAGNOSIS — D128 Benign neoplasm of rectum: Secondary | ICD-10-CM | POA: Diagnosis not present

## 2019-12-30 HISTORY — PX: COLONOSCOPY: SHX174

## 2019-12-30 MED ORDER — SODIUM CHLORIDE 0.9 % IV SOLN: 500.0000 mL | Freq: Once | INTRAVENOUS | Status: DC

## 2019-12-30 NOTE — Op Note (Signed)
[facility] [facility] Patient Name: Jay Ward Procedure Date: 12/30/2019 12:01 PM MRN: [id number] Endoscopist: Remo Lipps P. Havery [name] , MD Age: 49 Referring MD:  Date of Birth: [birth year] Gender: Male Account #: [id number] Procedure:                Colonoscopy Indications:              Screening for colorectal malignant neoplasm, This                            is the patient's first colonoscopy (aunt /                            grandparent with history of colon cancer) Medicines:                Monitored Anesthesia Care Procedure:                Pre-Anesthesia Assessment:                           - Prior to the procedure, a History and Physical                            was performed, and patient medications and                            allergies were reviewed. The patient's tolerance of                            previous anesthesia was also reviewed. The risks                            and benefits of the procedure and the sedation                            options and risks were discussed with the patient.                            All questions were answered, and informed consent                            was obtained. Prior Anticoagulants: The patient has                            taken no previous anticoagulant or antiplatelet                            agents. ASA Grade Assessment: II - A patient with                            mild systemic disease. After reviewing the risks                            and benefits, the patient was deemed in  satisfactory condition to undergo the procedure.                           After obtaining informed consent, the colonoscope                            was passed under direct vision. Throughout the                            procedure, the patient's blood pressure, pulse, and                            oxygen saturations were monitored continuously. The                            Colonoscope was  introduced through the anus and                            advanced to the the cecum, identified by                            appendiceal orifice and ileocecal valve. The                            colonoscopy was performed without difficulty. The                            patient tolerated the procedure well. The quality                            of the bowel preparation was good. The ileocecal                            valve, appendiceal orifice, and rectum were                            photographed. Scope In: 12:10:38 PM Scope Out: 12:43:17 PM Scope Withdrawal Time: 0 hours 27 minutes 6 seconds  Total Procedure Duration: 0 hours 32 minutes 39 seconds  Findings:                 The digital rectal exam revealed a rectal polypoid                            lesion                           Two sessile polyps were found in the cecum. The                            polyps were 3 mm in size. These polyps were removed                            with a cold snare. Resection and retrieval were  complete.                           A 3 mm polyp was found in the ascending colon. The                            polyp was sessile. The polyp was removed with a                            cold snare. Resection and retrieval were complete.                           Three sessile polyps were found in the transverse                            colon. The polyps were 3 to 6 mm in size. These                            polyps were removed with a cold snare. Resection                            and retrieval were complete.                           A 3 mm polyp was found in the descending colon. The                            polyp was sessile. The polyp was removed with a                            cold snare. Resection and retrieval were complete.                           Three sessile polyps were found in the sigmoid                            colon. The polyps were 3 to 5  mm in size. These                            polyps were removed with a cold snare. Resection                            and retrieval were complete.                           A 3 mm polyp was found in the recto-sigmoid colon.                            The polyp was sessile. The polyp was removed with a                            cold snare. Resection and retrieval were complete.  A 4 mm polyp was found in the rectum. The polyp was                            sessile. The polyp was removed with a cold snare.                            Resection and retrieval were complete.                           A diminutive polyp was found in the rectum. The                            polyp was sessile. The polyp was removed with a                            cold biopsy forceps. Resection and retrieval were                            complete.                           A roughly 20-25 mm polypoid lesion was found in the                            distal rectum. The lesion was semi-sessile with a                            broad base and arose from the dentate line. There                            was some slight umbilication in the central portion                            of it. Given risk for bleeding with removal from                            this location along the dentate line and its                            appearance it was not removed today. Biopsies were                            taken with a cold forceps for histology.                           Multiple small-mouthed diverticula were found in                            the transverse colon and left colon.                           The exam was otherwise without abnormality. Complications:  No immediate complications. Estimated blood loss:                            Minimal. Estimated Blood Loss:     Estimated blood loss was minimal. Impression:               -                           - Two 3 mm  polyps in the cecum, removed with a cold                            snare. Resected and retrieved.                           - One 3 mm polyp in the ascending colon, removed                            with a cold snare. Resected and retrieved.                           - Three 3 to 6 mm polyps in the transverse colon,                            removed with a cold snare. Resected and retrieved.                           - One 3 mm polyp in the descending colon, removed                            with a cold snare. Resected and retrieved.                           - Three 3 to 5 mm polyps in the sigmoid colon,                            removed with a cold snare. Resected and retrieved.                           - One 3 mm polyp at the recto-sigmoid colon,                            removed with a cold snare. Resected and retrieved.                           - One 4 mm polyp in the rectum, removed with a cold                            snare. Resected and retrieved.                           - One diminutive polyp in the rectum, removed with  a cold biopsy forceps. Resected and retrieved.                           - Polypoid lesion in the distal rectum arising from                            the dentate line as described. Biopsied.                           - Diverticulosis in the transverse colon and in the                            left colon.                           - The examination was otherwise normal. Recommendation:           - Patient has a contact number available for                            emergencies. The signs and symptoms of potential                            delayed complications were discussed with the                            patient. Return to normal activities tomorrow.                            Written discharge instructions were provided to the                            patient.                           - Resume previous diet.                            - Continue present medications.                           - Await pathology results.                           - Will discuss case with my advanced endoscopy                            colleagues in regards to options for removal of                            rectal polypoid lesion - attempt EMR at hospital                            vs. trans-anal excision with colorectal surgery,  and discuss with patient Carlota Raspberry. Mouna Yager, MD 12/30/2019 12:56:16 PM This report has been signed electronically.

## 2019-12-30 NOTE — Progress Notes (Signed)
pt tolerated well. VSS. awake and to recovery. Report given to RN.  

## 2019-12-30 NOTE — Patient Instructions (Signed)
Handout on polyps, diverticulosis and hemorrhoids given. ? ?YOU HAD AN ENDOSCOPIC PROCEDURE TODAY AT THE [facility]:   Refer to the procedure report that was given to you for any specific questions about what was found during the examination.  If the procedure report does not answer your questions, please call your gastroenterologist to clarify.  If you requested that your care partner not be given the details of your procedure findings, then the procedure report has been included in a sealed envelope for you to review at your convenience later. ? ?YOU SHOULD EXPECT: Some feelings of bloating in the abdomen. Passage of more gas than usual.  Walking can help get rid of the air that was put into your GI tract during the procedure and reduce the bloating. If you had a lower endoscopy (such as a colonoscopy or flexible sigmoidoscopy) you may notice spotting of blood in your stool or on the toilet paper. If you underwent a bowel prep for your procedure, you may not have a normal bowel movement for a few days. ? ?Please Note:  You might notice some irritation and congestion in your nose or some drainage.  This is from the oxygen used during your procedure.  There is no need for concern and it should clear up in a day or so. ? ?SYMPTOMS TO REPORT IMMEDIATELY: ? ?Following lower endoscopy (colonoscopy or flexible sigmoidoscopy): ? Excessive amounts of blood in the stool ? Significant tenderness or worsening of abdominal pains ? Swelling of the abdomen that is new, acute ? Fever of 100?F or higher ? ? ?For urgent or emergent issues, a gastroenterologist can be reached at any hour by calling [phone]. ?Do not use MyChart messaging for urgent concerns.  ? ? ?DIET:  We do recommend a small meal at first, but then you may proceed to your regular diet.  Drink plenty of fluids but you should avoid alcoholic beverages for 24 hours. ? ?ACTIVITY:  You should plan to take it easy for the rest of today and you  should NOT DRIVE or use heavy machinery until tomorrow (because of the sedation medicines used during the test).   ? ?FOLLOW UP: ?Our staff will call the number listed on your records 48-72 hours following your procedure to check on you and address any questions or concerns that you may have regarding the information given to you following your procedure. If we do not reach you, we will leave a message.  We will attempt to reach you two times.  During this call, we will ask if you have developed any symptoms of COVID 19. If you develop any symptoms (ie: fever, flu-like symptoms, shortness of breath, cough etc.) before then, please call [phone].  If you test positive for Covid 19 in the 2 weeks post procedure, please call and report this information to us.   ? ?If any biopsies were taken you will be contacted by phone or by letter within the next 1-3 weeks.  Please call us at [phone] if you have not heard about the biopsies in 3 weeks.  ? ? ?SIGNATURES/CONFIDENTIALITY: ?You and/or your care partner have signed paperwork which will be entered into your electronic medical record.  These signatures attest to the fact that that the information above on your After Visit Summary has been reviewed and is understood.  Full responsibility of the confidentiality of this discharge information lies with you and/or your care-partner.  ?

## 2019-12-30 NOTE — Progress Notes (Signed)
Pt. Reports no change in his medical or surgical history since his pre-visit 12/13/2019.

## 2019-12-30 NOTE — Progress Notes (Signed)
Called to room to assist during endoscopic procedure.  Patient ID and intended procedure confirmed with present staff. Received instructions for my participation in the procedure from the performing physician.  

## 2020-01-03 ENCOUNTER — Telehealth: Payer: Self-pay

## 2020-01-03 NOTE — Telephone Encounter (Signed)
  Follow up Call-  Call back number 12/30/2019  Post procedure Call Back phone  # [phone]  Permission to leave phone message Yes  Some recent data might be hidden     Patient questions:  Do you have a fever, pain , or abdominal swelling? No. Pain Score  0 *  Have you tolerated food without any problems? Yes.    Have you been able to return to your normal activities? Yes.    Do you have any questions about your discharge instructions: Diet   No. Medications  No. Follow up visit  No.  Do you have questions or concerns about your Care? No.  Actions: * If pain score is 4 or above: No action needed, pain <4.  1. Have you developed a fever since your procedure? no  2.   Have you had an respiratory symptoms (SOB or cough) since your procedure? no  3.   Have you tested positive for COVID 19 since your procedure no  4.   Have you had any family members/close contacts diagnosed with the COVID 19 since your procedure?  no   If yes to any of these questions please route to Joylene John, RN and Joella Prince, RN

## 2020-01-06 ENCOUNTER — Other Ambulatory Visit: Payer: Self-pay

## 2020-01-06 DIAGNOSIS — K629 Disease of anus and rectum, unspecified: Secondary | ICD-10-CM

## 2020-01-06 DIAGNOSIS — D128 Benign neoplasm of rectum: Secondary | ICD-10-CM

## 2020-01-14 ENCOUNTER — Other Ambulatory Visit: Payer: Self-pay | Admitting: Gastroenterology

## 2020-01-14 ENCOUNTER — Ambulatory Visit (HOSPITAL_COMMUNITY)
Admission: RE | Admit: 2020-01-14 | Discharge: 2020-01-14 | Disposition: A | Discharge status: Home / Self Care | Payer: BC Managed Care – PPO | Source: Ambulatory Visit | Attending: Gastroenterology | Admitting: Gastroenterology

## 2020-01-14 ENCOUNTER — Other Ambulatory Visit: Payer: Self-pay

## 2020-01-14 DIAGNOSIS — K621 Rectal polyp: Secondary | ICD-10-CM | POA: Diagnosis not present

## 2020-01-14 DIAGNOSIS — K629 Disease of anus and rectum, unspecified: Secondary | ICD-10-CM

## 2020-01-14 DIAGNOSIS — D128 Benign neoplasm of rectum: Secondary | ICD-10-CM | POA: Insufficient documentation

## 2020-01-17 DIAGNOSIS — D128 Benign neoplasm of rectum: Secondary | ICD-10-CM | POA: Diagnosis not present

## 2020-01-19 ENCOUNTER — Encounter (HOSPITAL_COMMUNITY): Payer: Self-pay

## 2020-01-19 NOTE — Patient Instructions (Addendum)
DUE TO COVID-19 ONLY ONE VISITOR IS ALLOWED TO COME WITH YOU AND STAY IN THE WAITING ROOM ONLY DURING PRE OP AND PROCEDURE.    COVID SWAB TESTING MUST BE COMPLETED ON:  Monday, Nov. 8, 2021 at 12:30PM   [address] [place], [place] [address]  (Must self quarantine after testing. Follow instructions on handout.)       Your procedure is scheduled on: Thursday, Nov. 11, 2021   Report to [facility] Main  Entrance    Report to admitting at 9:30 AM   Call this number if you have problems the morning of surgery [phone]   Do not eat food :After Midnight.   May have liquids until 8:30AM   day of surgery  CLEAR LIQUID DIET  Foods Allowed                                                                     Foods Excluded  Water, Black Coffee and tea, regular and decaf                             liquids that you cannot  Plain Jell-O in any flavor  (No red)                                           see through such as: Fruit ices (not with fruit pulp)                                     milk, soups, orange juice              Iced Popsicles (No red)                                    All solid food                                   Apple juices Sports drinks like Gatorade (No red) Lightly seasoned clear broth or consume(fat free) Sugar, honey syrup  Sample Menu Breakfast                                Lunch                                     Supper Cranberry juice                    Beef broth                            Chicken broth Jell-O  Grape juice                           Apple juice Coffee or tea                        Jell-O                                      Popsicle                                                Coffee or tea                        Coffee or tea      Oral Hygiene is also important to reduce your risk of infection.                                    Remember - BRUSH YOUR TEETH THE MORNING OF SURGERY WITH YOUR  REGULAR TOOTHPASTE   Do NOT smoke after Midnight   Take these medicines the morning of surgery with A SIP OF WATER: None   Use fleet enema the night before surgery and the morning of surgery                               You may not have any metal on your body including jewelry, and body piercings             Do not wear lotions, powders, perfumes/cologne, or deodorant                          Men may shave face and neck.   Do not bring valuables to the hospital. [place].   Contacts, dentures or bridgework may not be worn into surgery.   Patients discharged the day of surgery will not be allowed to drive home.   Special Instructions: Bring a copy of your healthcare power of attorney and living will documents         the day of surgery if you haven't scanned them in before.              Please read over the following fact sheets you were given: IF YOU HAVE QUESTIONS ABOUT YOUR PRE OP INSTRUCTIONS PLEASE CALL [phone]   [facility] - Preparing for Surgery Before surgery, you can play an important role.  Because skin is not sterile, your skin needs to be as free of germs as possible.  You can reduce the number of germs on your skin by washing with CHG (chlorahexidine gluconate) soap before surgery.  CHG is an antiseptic cleaner which kills germs and bonds with the skin to continue killing germs even after washing. Please DO NOT use if you have an allergy to CHG or antibacterial soaps.  If your skin becomes reddened/irritated stop using the CHG and inform your nurse when you arrive  at Short Stay. Do not shave (including legs and underarms) for at least 48 hours prior to the first CHG shower.  You may shave your face/neck.  Please follow these instructions carefully:  1.  Shower with CHG Soap the night before surgery and the  morning of surgery.  2.  If you choose to wash your hair, wash your hair first as usual with your normal   shampoo.  3.  After you shampoo, rinse your hair and body thoroughly to remove the shampoo.                             4.  Use CHG as you would any other liquid soap.  You can apply chg directly to the skin and wash.  Gently with a scrungie or clean washcloth.  5.  Apply the CHG Soap to your body ONLY FROM THE NECK DOWN.   Do   not use on face/ open                           Wound or open sores. Avoid contact with eyes, ears mouth and   genitals (private parts).                       Wash face,  Genitals (private parts) with your normal soap.             6.  Wash thoroughly, paying special attention to the area where your    surgery  will be performed.  7.  Thoroughly rinse your body with warm water from the neck down.  8.  DO NOT shower/wash with your normal soap after using and rinsing off the CHG Soap.                9.  Pat yourself dry with a clean towel.            10.  Wear clean pajamas.            11.  Place clean sheets on your bed the night of your first shower and do not  sleep with pets. Day of Surgery : Do not apply any lotions/deodorants the morning of surgery.  Please wear clean clothes to the hospital/surgery center.  FAILURE TO FOLLOW THESE INSTRUCTIONS MAY RESULT IN THE CANCELLATION OF YOUR SURGERY  PATIENT SIGNATURE_________________________________  NURSE SIGNATURE__________________________________  ________________________________________________________________________

## 2020-01-19 NOTE — Progress Notes (Addendum)
COVID Vaccine Completed: Yes Date COVID Vaccine completed: 06/11/19, 07/02/19 COVID vaccine manufacturer: [employer]       PCP - Dr. Shawnie Dapper last office visit 10/26/19 in epic Cardiologist - N/A  Chest x-ray - N/A EKG - N/A Stress Test - N/A ECHO - N/A Cardiac Cath - N/A Pacemaker/ICD device last checked:N/A  Sleep Study - N/A CPAP - N/A  Fasting Blood Sugar - N/A Checks Blood Sugar __N/A___ times a day  Blood Thinner Instructions: N/A Aspirin Instructions: N/A Last Dose: N/A  Anesthesia review:  N/A  Patient denies shortness of breath, fever, cough and chest pain at PAT appointment   Patient verbalized understanding of instructions that were given to them at the PAT appointment. Patient was also instructed that they will need to review over the PAT instructions again at home before surgery.

## 2020-01-20 ENCOUNTER — Telehealth: Payer: Self-pay | Admitting: Genetic Counselor

## 2020-01-20 NOTE — Telephone Encounter (Signed)
Received a genetic counseling referral from Dr. Dema Severin at [facility] for fhx of cancer. Jay Ward has been cld and scheduled to see Raquel Sarna on 12/7 at 1pm. Pt aware to arrive 15 minutes early. Letter mailed.

## 2020-01-22 ENCOUNTER — Encounter: Payer: Self-pay | Admitting: Family Medicine

## 2020-01-25 NOTE — Progress Notes (Signed)
Spoke with Abigail Butts at Dr. Orest Dikes office to request  pre-op orders in epic.

## 2020-01-26 ENCOUNTER — Ambulatory Visit: Payer: Self-pay | Admitting: Surgery

## 2020-01-26 NOTE — H&P (View-Only) (Signed)
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 He underwent a colonoscopy with Dr. Havery [name] 12/30/19 which demonstrated:  1. 2 sessile polyps in the cecum, removed- TA 2. 3 mm polyp ascending colon, removed- TA 3. 3 sessile polyps in the transverse colon, removed- TA 4. 3 mm polyp in ascending colon, removed- TA 5. 3 sessile polyps in the sigmoid, removed- TA 6. 3 mm polyp in the rectosigmoid, removed- TA 7. 4 mm polyp in the rectum, removed- TA 8. Diminutive polyp in the rectum, removed- TA 9. 2-2.5 cm polypoid lesion in the distal rectum semi-sessile with broad base and arose from dentate with slight umbilication. Biopsies returned tubular adenoma with high-grade dysplasia  He underwent pelvic MRI which demonstrated 24 mm polypoid lesion in distal rectum just above internal anal sphincter corresponding to lesion seen on scope. No invasion of underlying muscularis propria. 5 mm short axis L perirectal node nonspecific, favored benign.  He is here today for follow-up. He denies any complaints. He denies any blood in his stool. He denies any anal/rectal pain.  PMH: Testicular cancer, melanoma, basal cell carcinoma  PSH: right orchiectomy and right inguinal hernia. Mesh-2003. He did receive chemotherapy. He underwent surveillance and was deemed cured. This was an [place] of his back which she states she had excised as well as a sentinel lymph node dissection of his left axilla-[place], years ago. Basal cell carcinoma of the nose removed with local excision.  FHx: Denies FHx of colorectal, breast, endometrial, ovarian or cervical cancer  Social: Denies use of  tobacco/EtOH/drugs  ROS: A comprehensive 10 system review of systems was completed with the patient and pertinent findings as noted above.  The patient is a 49 year old male.   Past Surgical History Jay Ward, RMA; 01/17/2020 11:04 AM) Appendectomy  Colon Polyp Removal - Colonoscopy  Open Inguinal Hernia Surgery  Right. Sentinel Lymph Node Biopsy   Diagnostic Studies History Jay Ward, RMA; 01/17/2020 11:04 AM) Colonoscopy  within last year  Allergies Jay Ward, RMA; 01/17/2020 11:04 AM) No Known Drug Allergies  [01/17/2020]: Allergies Reconciled   Medication History Jay Ward, RMA; 01/17/2020 11:04 AM) Vitamin C (250MG  Tablet Chewable, Oral) Active. Medications Reconciled  Social History Jay Ward, RMA; 01/17/2020 11:04 AM) Alcohol use  Moderate alcohol use. Caffeine use  Coffee. No drug use  Tobacco use  Former smoker.  Family History Jay Ward, RMA; 01/17/2020 11:04 AM) Cancer  Jay Ward. Colon Cancer  Family Members In General. Colon Polyps  Jay Ward, Family Members In General, Jay Ward. Prostate Cancer  Jay Ward.  Other Problems Jay Bers [place], RMA; 01/17/2020 11:04 AM) Cancer  Inguinal Hernia  Melanoma     Review of Systems Jay Ward; 01/17/2020 11:56 AM) General Not Present- Appetite Loss, Chills, Fatigue, Fever, Night Sweats, Weight Gain and Weight Loss. Skin Not Present- Change in Wart/Mole, Dryness, Hives, Jaundice, New Lesions, Non-Healing Wounds, Rash and Ulcer. HEENT Present- Wears glasses/contact lenses. Not Present- Earache, Hearing Loss, Hoarseness, Nose Bleed, Oral Ulcers, Ringing in the Ears, Seasonal Allergies, Sinus Pain, Sore Throat, Visual Disturbances and Yellow Eyes. Respiratory Not Present- Bloody sputum, Chronic Cough, Difficulty Breathing, Snoring and Wheezing. Breast Not Present- Breast Mass, Breast Pain, Nipple Discharge and Skin  Changes. Cardiovascular Not Present- Chest Pain, Difficulty  Breathing Lying Down, Leg Cramps, Palpitations, Rapid Heart Rate, Shortness of Breath and Swelling of Extremities. Gastrointestinal Present- Hemorrhoids. Not Present- Abdominal Pain, Bloating, Bloody Stool, Change in Bowel Habits, Chronic diarrhea, Constipation, Difficulty Swallowing, Excessive gas, Gets full quickly at meals, Indigestion, Nausea, Rectal Pain and Vomiting. Male Genitourinary Not Present- Blood in Urine, Change in Urinary Stream, Frequency, Impotence, Nocturia, Painful Urination, Urgency and Urine Leakage. Neurological Not Present- Decreased Memory and Difficulty Speaking. Psychiatric Not Present- Anxiety and Depression. Hematology Not Present- Abnormal Bleeding, Blood Clots and Blood Thinners.  Vitals Jay Ward RMA; 01/17/2020 11:05 AM) 01/17/2020 11:04 AM Weight: 186.8 lb Height: 71in Body Surface Area: 2.05 m Body Mass Index: 26.05 kg/m  Temp.: 97.74F (Temporal)  Pulse: 101 (Regular)  P.OX: 99% (Room air) BP: 120/82(Sitting, Left Arm, Standard)       Physical Exam Jay Ward; 01/17/2020 12:02 PM) The physical exam findings are as follows: Note: Constitutional: No acute distress; conversant; no deformities; wearing mask Eyes: Moist conjunctiva; no lid lag; anicteric sclerae; pupils equal and round Neck: Trachea midline; no palpable thyromegaly Lungs: Normal respiratory effort; no tactile fremitus CV: rrr; no palpable thrill; no pitting edema GI: Abdomen soft, nontender, nondistended; no palpable hepatosplenomegaly Anorectal: Normal perianal skin, no lesions. DRE - normal tone and squeeze. Polypoid mass mobile, somewhat soft left lateral approximately 1 cm from anal verge. Anoscopy: circumferential endoscopy demonstrates small internal hemorrhoids. Lesion of concern appears irrigated from the left lateral portion of the distalmost aspect of rectum. No fungating nor  clearly malignant appearance - appears like an adenomatous lesion. MSK: Normal gait; no clubbing/cyanosis Psychiatric: Appropriate affect; alert and oriented 3 Lymphatic: No palpable cervical or axillary lymphadenopathy **A chaperone, Jay Ward, was present for this encounter    Assessment & Plan Jay Gave M. Rylea Selway Ward; 01/17/2020 12:03 PM) ADENOMATOUS RECTAL POLYP (D12.8) Story: Mr. Shonka is a very pleasant 75yoM with hx of testicular ca, melanoma, basal cell carcinoma of nose - here for rectal polypoid lesion found on screening colonoscopy (in background of >10 adenomatous polyps found on his scope as well) Bx - TA with HGD MRI - no clear invasion of muscularis; nonspecific 5 mm perirectal node favored benign Impression: -Genetics referral -The anatomy and physiology of the anal canal was discussed at length with the patient. The pathophysiology of anorectal polyps and cancer was discussed at length with associated pictures and illustrations. -Given this lesion being in the distalmost aspect of the rectum, would not be a good candidate for any sort of TAMIS procedure but instead trans-anal excision. We discussed transanal excision of this with anorectal examination under in anesthesia. -The planned procedure, material risks (including, but not limited to, pain, bleeding, infection, scarring, need for blood transfusion, damage to anal sphincter, incontinence of gas and/or stool, need for additional procedures including involved margins, recurrence, pneumonia, heart attack, stroke, death) benefits and alternatives to surgery were discussed at length. We discussed potential for further treatments being recommended following excision but that would be based on findings/pathology. The patient's questions were answered to his satisfaction, he voiced understanding and elected to proceed with surgery. Additionally, we discussed typical postoperative expectations and the recovery  process.  This patient encounter took 39 minutes today to perform the following: take history, perform exam, review outside records, interpret imaging, counsel the patient on their diagnosis and document encounter, findings & plan in the EHR  Current Plans ANOSCOPY, DIAGNOSTIC (61443) (Anoscopy: circumferential endoscopy demonstrates small internal hemorrhoids. Lesion of concern appears irrigated from the left lateral  portion of the distalmost aspect of rectum. No fungating nor clearly malignant appearance - appears lik) Signed by Ileana Roup, Ward (01/17/2020 12:03 PM)

## 2020-01-26 NOTE — H&P (Signed)
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 Payer is a very pleasant 49yoM with hx of testicular cancer (2003), melanoma of back (years past), basal cell carcinoma of nose, referred to our office for evaluation of a newly diagnosed rectal polypoid lesion. He underwent a colonoscopy with Dr. Havery [name] 12/30/19 which demonstrated:  1. 2 sessile polyps in the cecum, removed- TA 2. 3 mm polyp ascending colon, removed- TA 3. 3 sessile polyps in the transverse colon, removed- TA 4. 3 mm polyp in ascending colon, removed- TA 5. 3 sessile polyps in the sigmoid, removed- TA 6. 3 mm polyp in the rectosigmoid, removed- TA 7. 4 mm polyp in the rectum, removed- TA 8. Diminutive polyp in the rectum, removed- TA 9. 2-2.5 cm polypoid lesion in the distal rectum semi-sessile with broad base and arose from dentate with slight umbilication. Biopsies returned tubular adenoma with high-grade dysplasia  He underwent pelvic MRI which demonstrated 24 mm polypoid lesion in distal rectum just above internal anal sphincter corresponding to lesion seen on scope. No invasion of underlying muscularis propria. 5 mm short axis L perirectal node nonspecific, favored benign.  He is here today for follow-up. He denies any complaints. He denies any blood in his stool. He denies any anal/rectal pain.  PMH: Testicular cancer, melanoma, basal cell carcinoma  PSH: right orchiectomy and right inguinal hernia. Mesh-2003. He did receive chemotherapy. He underwent surveillance and was deemed cured. This was an [place] of his back which she states she had excised as well as a sentinel lymph node dissection of his left axilla-[place], years ago. Basal cell carcinoma of the nose removed with local excision.  FHx: Denies FHx of colorectal, breast, endometrial, ovarian or cervical cancer  Social: Denies use of  tobacco/EtOH/drugs  ROS: A comprehensive 10 system review of systems was completed with the patient and pertinent findings as noted above.  The patient is a 49 year old male.   Past Surgical History Geni Bers [place], RMA; 01/17/2020 11:04 AM) Appendectomy  Colon Polyp Removal - Colonoscopy  Open Inguinal Hernia Surgery  Right. Sentinel Lymph Node Biopsy   Diagnostic Studies History Geni Bers [place], RMA; 01/17/2020 11:04 AM) Colonoscopy  within last year  Allergies [name], RMA; 01/17/2020 11:04 AM) No Known Drug Allergies  [01/17/2020]: Allergies Reconciled   Medication History [employer], RMA; 01/17/2020 11:04 AM) Vitamin C (250MG  Tablet Chewable, Oral) Active. Medications Reconciled  Social History Geni Bers [place], RMA; 01/17/2020 11:04 AM) Alcohol use  Moderate alcohol use. Caffeine use  Coffee. No drug use  Tobacco use  Former smoker.  Family History Geni Bers [place], RMA; 01/17/2020 11:04 AM) Cancer  Father. Colon Cancer  Family Members In General. Colon Polyps  Brother, Family Members In General, Father. Prostate Cancer  Father.  Other Problems Geni Bers [place], RMA; 01/17/2020 11:04 AM) Cancer  Inguinal Hernia  Melanoma     Review of Systems [place]rell Gave M. Katelinn Justice MD; 01/17/2020 11:56 AM) General Not Present- Appetite Loss, Chills, Fatigue, Fever, Night Sweats, Weight Gain and Weight Loss. Skin Not Present- Change in Wart/Mole, Dryness, Hives, Jaundice, New Lesions, Non-Healing Wounds, Rash and Ulcer. HEENT Present- Wears glasses/contact lenses. Not Present- Earache, Hearing Loss, Hoarseness, Nose Bleed, Oral Ulcers, Ringing in the Ears, Seasonal Allergies, Sinus Pain, Sore Throat, Visual Disturbances and Yellow Eyes. Respiratory Not Present- Bloody sputum, Chronic Cough, Difficulty Breathing, Snoring and Wheezing. Breast Not Present- Breast Mass, Breast Pain, Nipple Discharge and Skin  Changes. Cardiovascular Not Present- Chest Pain, Difficulty  Breathing Lying Down, Leg Cramps, Palpitations, Rapid Heart Rate, Shortness of Breath and Swelling of Extremities. Gastrointestinal Present- Hemorrhoids. Not Present- Abdominal Pain, Bloating, Bloody Stool, Change in Bowel Habits, Chronic diarrhea, Constipation, Difficulty Swallowing, Excessive gas, Gets full quickly at meals, Indigestion, Nausea, Rectal Pain and Vomiting. Male Genitourinary Not Present- Blood in Urine, Change in Urinary Stream, Frequency, Impotence, Nocturia, Painful Urination, Urgency and Urine Leakage. Neurological Not Present- Decreased Memory and Difficulty Speaking. Psychiatric Not Present- Anxiety and Depression. Hematology Not Present- Abnormal Bleeding, Blood Clots and Blood Thinners.  Vitals [name] RMA; 01/17/2020 11:05 AM) 01/17/2020 11:04 AM Weight: 186.8 lb Height: 71in Body Surface Area: 2.05 m Body Mass Index: 26.05 kg/m  Temp.: 97.36F (Temporal)  Pulse: 101 (Regular)  P.OX: 99% (Room air) BP: 120/82(Sitting, Left Arm, Standard)       Physical Exam [place]rell Gave M. Ashani Pumphrey MD; 01/17/2020 12:02 PM) The physical exam findings are as follows: Note: Constitutional: No acute distress; conversant; no deformities; wearing mask Eyes: Moist conjunctiva; no lid lag; anicteric sclerae; pupils equal and round Neck: Trachea midline; no palpable thyromegaly Lungs: Normal respiratory effort; no tactile fremitus CV: rrr; no palpable thrill; no pitting edema GI: Abdomen soft, nontender, nondistended; no palpable hepatosplenomegaly Anorectal: Normal perianal skin, no lesions. DRE - normal tone and squeeze. Polypoid mass mobile, somewhat soft left lateral approximately 1 cm from anal verge. Anoscopy: circumferential endoscopy demonstrates small internal hemorrhoids. Lesion of concern appears irrigated from the left lateral portion of the distalmost aspect of rectum. No fungating nor  clearly malignant appearance - appears like an adenomatous lesion. MSK: Normal gait; no clubbing/cyanosis Psychiatric: Appropriate affect; alert and oriented 3 Lymphatic: No palpable cervical or axillary lymphadenopathy **A chaperone, [employer], was present for this encounter    Assessment & Plan Harrell Gave M. Saydee Zolman MD; 01/17/2020 12:03 PM) ADENOMATOUS RECTAL POLYP (D12.8) Story: Mr. Matheson is a very pleasant 74yoM with hx of testicular ca, melanoma, basal cell carcinoma of nose - here for rectal polypoid lesion found on screening colonoscopy (in background of >10 adenomatous polyps found on his scope as well) Bx - TA with HGD MRI - no clear invasion of muscularis; nonspecific 5 mm perirectal node favored benign Impression: -Genetics referral -The anatomy and physiology of the anal canal was discussed at length with the patient. The pathophysiology of anorectal polyps and cancer was discussed at length with associated pictures and illustrations. -Given this lesion being in the distalmost aspect of the rectum, would not be a good candidate for any sort of TAMIS procedure but instead trans-anal excision. We discussed transanal excision of this with anorectal examination under in anesthesia. -The planned procedure, material risks (including, but not limited to, pain, bleeding, infection, scarring, need for blood transfusion, damage to anal sphincter, incontinence of gas and/or stool, need for additional procedures including involved margins, recurrence, pneumonia, heart attack, stroke, death) benefits and alternatives to surgery were discussed at length. We discussed potential for further treatments being recommended following excision but that would be based on findings/pathology. The patient's questions were answered to his satisfaction, he voiced understanding and elected to proceed with surgery. Additionally, we discussed typical postoperative expectations and the recovery  process.  This patient encounter took 39 minutes today to perform the following: take history, perform exam, review outside records, interpret imaging, counsel the patient on their diagnosis and document encounter, findings & plan in the EHR  Current Plans ANOSCOPY, DIAGNOSTIC (82956) (Anoscopy: circumferential endoscopy demonstrates small internal hemorrhoids. Lesion of concern appears irrigated from the left lateral  portion of the distalmost aspect of rectum. No fungating nor clearly malignant appearance - appears lik) Signed by Ileana Roup, MD (01/17/2020 12:03 PM)

## 2020-01-27 ENCOUNTER — Other Ambulatory Visit: Payer: Self-pay

## 2020-01-27 ENCOUNTER — Encounter (HOSPITAL_COMMUNITY)
Admission: RE | Admit: 2020-01-27 | Discharge: 2020-01-27 | Disposition: A | Discharge status: Home / Self Care | Payer: BC Managed Care – PPO | Source: Ambulatory Visit | Attending: Surgery | Admitting: Surgery

## 2020-01-27 ENCOUNTER — Encounter (HOSPITAL_COMMUNITY): Payer: Self-pay

## 2020-01-27 DIAGNOSIS — Z01812 Encounter for preprocedural laboratory examination: Secondary | ICD-10-CM | POA: Insufficient documentation

## 2020-01-27 HISTORY — DX: Other complications of anesthesia, initial encounter: T88.59XA

## 2020-01-27 HISTORY — DX: Personal history of colon polyps, unspecified: Z86.0100

## 2020-01-27 HISTORY — DX: Unilateral inguinal hernia, without obstruction or gangrene, not specified as recurrent: K40.90

## 2020-01-27 HISTORY — DX: Personal history of colonic polyps: Z86.010

## 2020-01-27 LAB — CBC
HCT: 41.2 % (ref 39.0–52.0)
Hemoglobin: 13.9 g/dL (ref 13.0–17.0)
MCH: 29.5 pg (ref 26.0–34.0)
MCHC: 33.7 g/dL (ref 30.0–36.0)
MCV: 87.5 fL (ref 80.0–100.0)
Platelets: 244 10*3/uL (ref 150–400)
RBC: 4.71 MIL/uL (ref 4.22–5.81)
RDW: 13.7 % (ref 11.5–15.5)
WBC: 4.9 10*3/uL (ref 4.0–10.5)
nRBC: 0 % (ref 0.0–0.2)

## 2020-01-30 DIAGNOSIS — C20 Malignant neoplasm of rectum: Secondary | ICD-10-CM

## 2020-01-30 HISTORY — DX: Malignant neoplasm of rectum: C20

## 2020-02-06 ENCOUNTER — Other Ambulatory Visit (HOSPITAL_COMMUNITY)
Admission: RE | Admit: 2020-02-06 | Discharge: 2020-02-06 | Disposition: A | Discharge status: Home / Self Care | Payer: BC Managed Care – PPO | Source: Ambulatory Visit | Attending: Surgery | Admitting: Surgery

## 2020-02-06 DIAGNOSIS — Z85828 Personal history of other malignant neoplasm of skin: Secondary | ICD-10-CM | POA: Diagnosis not present

## 2020-02-06 DIAGNOSIS — Z85820 Personal history of malignant melanoma of skin: Secondary | ICD-10-CM | POA: Diagnosis not present

## 2020-02-06 DIAGNOSIS — Z20822 Contact with and (suspected) exposure to covid-19: Secondary | ICD-10-CM | POA: Insufficient documentation

## 2020-02-06 DIAGNOSIS — C218 Malignant neoplasm of overlapping sites of rectum, anus and anal canal: Secondary | ICD-10-CM | POA: Diagnosis not present

## 2020-02-06 DIAGNOSIS — K620 Anal polyp: Secondary | ICD-10-CM | POA: Diagnosis not present

## 2020-02-06 DIAGNOSIS — Z8371 Family history of colonic polyps: Secondary | ICD-10-CM | POA: Diagnosis not present

## 2020-02-06 DIAGNOSIS — Z8042 Family history of malignant neoplasm of prostate: Secondary | ICD-10-CM | POA: Diagnosis not present

## 2020-02-06 DIAGNOSIS — Z87891 Personal history of nicotine dependence: Secondary | ICD-10-CM | POA: Diagnosis not present

## 2020-02-06 DIAGNOSIS — Z800 Family history of malignant neoplasm of digestive organs: Secondary | ICD-10-CM | POA: Diagnosis not present

## 2020-02-06 DIAGNOSIS — Z8547 Personal history of malignant neoplasm of testis: Secondary | ICD-10-CM | POA: Diagnosis not present

## 2020-02-06 DIAGNOSIS — Z01812 Encounter for preprocedural laboratory examination: Secondary | ICD-10-CM | POA: Insufficient documentation

## 2020-02-06 LAB — SARS CORONAVIRUS 2 (TAT 6-24 HRS): SARS Coronavirus 2: NEGATIVE

## 2020-02-08 MED ORDER — BUPIVACAINE LIPOSOME 1.3 % IJ SUSP
20.0000 mL | Freq: Once | INTRAMUSCULAR | Status: DC
  Filled 2020-02-08: qty 20

## 2020-02-09 ENCOUNTER — Other Ambulatory Visit: Payer: Self-pay

## 2020-02-09 ENCOUNTER — Ambulatory Visit (HOSPITAL_COMMUNITY): Payer: BC Managed Care – PPO | Admitting: Anesthesiology

## 2020-02-09 ENCOUNTER — Ambulatory Visit (HOSPITAL_COMMUNITY)
Admission: RE | Admit: 2020-02-09 | Discharge: 2020-02-09 | Disposition: A | Discharge status: Home / Self Care | Payer: BC Managed Care – PPO | Attending: Surgery | Admitting: Surgery

## 2020-02-09 ENCOUNTER — Encounter (HOSPITAL_COMMUNITY): Payer: Self-pay | Admitting: Surgery

## 2020-02-09 ENCOUNTER — Encounter (HOSPITAL_COMMUNITY)
Admission: RE | Disposition: A | Discharge status: Home / Self Care | Payer: Self-pay | Source: Home / Self Care | Attending: Surgery

## 2020-02-09 DIAGNOSIS — K621 Rectal polyp: Secondary | ICD-10-CM | POA: Diagnosis not present

## 2020-02-09 DIAGNOSIS — K6289 Other specified diseases of anus and rectum: Secondary | ICD-10-CM | POA: Diagnosis not present

## 2020-02-09 DIAGNOSIS — C20 Malignant neoplasm of rectum: Secondary | ICD-10-CM | POA: Diagnosis not present

## 2020-02-09 DIAGNOSIS — K620 Anal polyp: Secondary | ICD-10-CM | POA: Diagnosis not present

## 2020-02-09 DIAGNOSIS — Z20822 Contact with and (suspected) exposure to covid-19: Secondary | ICD-10-CM | POA: Insufficient documentation

## 2020-02-09 DIAGNOSIS — Z8371 Family history of colonic polyps: Secondary | ICD-10-CM | POA: Insufficient documentation

## 2020-02-09 DIAGNOSIS — Z800 Family history of malignant neoplasm of digestive organs: Secondary | ICD-10-CM | POA: Insufficient documentation

## 2020-02-09 DIAGNOSIS — C218 Malignant neoplasm of overlapping sites of rectum, anus and anal canal: Secondary | ICD-10-CM | POA: Insufficient documentation

## 2020-02-09 DIAGNOSIS — Z87891 Personal history of nicotine dependence: Secondary | ICD-10-CM | POA: Diagnosis not present

## 2020-02-09 DIAGNOSIS — Z8042 Family history of malignant neoplasm of prostate: Secondary | ICD-10-CM | POA: Insufficient documentation

## 2020-02-09 DIAGNOSIS — Z8547 Personal history of malignant neoplasm of testis: Secondary | ICD-10-CM | POA: Insufficient documentation

## 2020-02-09 DIAGNOSIS — D129 Benign neoplasm of anus and anal canal: Secondary | ICD-10-CM | POA: Diagnosis not present

## 2020-02-09 DIAGNOSIS — K6282 Dysplasia of anus: Secondary | ICD-10-CM | POA: Diagnosis not present

## 2020-02-09 DIAGNOSIS — Z85820 Personal history of malignant melanoma of skin: Secondary | ICD-10-CM | POA: Insufficient documentation

## 2020-02-09 DIAGNOSIS — Z85828 Personal history of other malignant neoplasm of skin: Secondary | ICD-10-CM | POA: Diagnosis not present

## 2020-02-09 HISTORY — PX: RECTAL EXAM UNDER ANESTHESIA: SHX6399

## 2020-02-09 HISTORY — PX: TRANSANAL EXCISION OF RECTAL MASS: SHX6134

## 2020-02-09 SURGERY — EXCISION, MASS, RECTUM, ANAL APPROACH
Anesthesia: General | Site: Rectum

## 2020-02-09 MED ORDER — BUPIVACAINE LIPOSOME 1.3 % IJ SUSP
INTRAMUSCULAR | Status: DC | PRN
  Administered 2020-02-09: 20 mL

## 2020-02-09 MED ORDER — LIDOCAINE 2% (20 MG/ML) 5 ML SYRINGE
INTRAMUSCULAR | Status: AC
  Filled 2020-02-09: qty 5

## 2020-02-09 MED ORDER — FENTANYL CITRATE (PF) 100 MCG/2ML IJ SOLN
INTRAMUSCULAR | Status: AC
  Filled 2020-02-09: qty 2

## 2020-02-09 MED ORDER — GLYCOPYRROLATE PF 0.2 MG/ML IJ SOSY
PREFILLED_SYRINGE | INTRAMUSCULAR | Status: DC | PRN
  Administered 2020-02-09: .2 mg via INTRAVENOUS

## 2020-02-09 MED ORDER — SUCCINYLCHOLINE CHLORIDE 20 MG/ML IJ SOLN
INTRAMUSCULAR | Status: DC | PRN
  Administered 2020-02-09: 140 mg via INTRAVENOUS

## 2020-02-09 MED ORDER — CHLORHEXIDINE GLUCONATE 0.12 % MT SOLN
15.0000 mL | Freq: Once | OROMUCOSAL | Status: AC
  Administered 2020-02-09: 15 mL via OROMUCOSAL

## 2020-02-09 MED ORDER — GLYCOPYRROLATE PF 0.2 MG/ML IJ SOSY
PREFILLED_SYRINGE | INTRAMUSCULAR | Status: AC
  Filled 2020-02-09: qty 1

## 2020-02-09 MED ORDER — ORAL CARE MOUTH RINSE: 15.0000 mL | Freq: Once | OROMUCOSAL | Status: AC

## 2020-02-09 MED ORDER — CHLORHEXIDINE GLUCONATE CLOTH 2 % EX PADS: 6.0000 | MEDICATED_PAD | Freq: Once | CUTANEOUS | Status: DC

## 2020-02-09 MED ORDER — DEXMEDETOMIDINE (PRECEDEX) IN NS 20 MCG/5ML (4 MCG/ML) IV SYRINGE
PREFILLED_SYRINGE | INTRAVENOUS | Status: DC | PRN
  Administered 2020-02-09 (×2): 4 ug via INTRAVENOUS
  Administered 2020-02-09: 8 ug via INTRAVENOUS

## 2020-02-09 MED ORDER — SODIUM CHLORIDE 0.9 % IV SOLN
2.0000 g | INTRAVENOUS | Status: AC
  Administered 2020-02-09: 2 g via INTRAVENOUS
  Filled 2020-02-09: qty 20

## 2020-02-09 MED ORDER — ONDANSETRON HCL 4 MG/2ML IJ SOLN
INTRAMUSCULAR | Status: DC | PRN
  Administered 2020-02-09: 4 mg via INTRAVENOUS

## 2020-02-09 MED ORDER — METRONIDAZOLE IN NACL 5-0.79 MG/ML-% IV SOLN
500.0000 mg | INTRAVENOUS | Status: AC
  Administered 2020-02-09: 500 mg via INTRAVENOUS
  Filled 2020-02-09: qty 100

## 2020-02-09 MED ORDER — SODIUM CHLORIDE (PF) 0.9 % IJ SOLN
INTRAMUSCULAR | Status: DC | PRN
  Administered 2020-02-09: 6 mL

## 2020-02-09 MED ORDER — EPHEDRINE 5 MG/ML INJ
INTRAVENOUS | Status: AC
  Filled 2020-02-09: qty 20

## 2020-02-09 MED ORDER — CELECOXIB 200 MG PO CAPS
200.0000 mg | ORAL_CAPSULE | Freq: Once | ORAL | Status: AC
  Administered 2020-02-09: 200 mg via ORAL
  Filled 2020-02-09: qty 1

## 2020-02-09 MED ORDER — LACTATED RINGERS IV SOLN: INTRAVENOUS | Status: DC

## 2020-02-09 MED ORDER — PROPOFOL 10 MG/ML IV BOLUS
INTRAVENOUS | Status: AC
  Filled 2020-02-09: qty 20

## 2020-02-09 MED ORDER — ACETAMINOPHEN 500 MG PO TABS: 1000.0000 mg | ORAL_TABLET | ORAL | Status: AC

## 2020-02-09 MED ORDER — DEXAMETHASONE SODIUM PHOSPHATE 10 MG/ML IJ SOLN
INTRAMUSCULAR | Status: DC | PRN
  Administered 2020-02-09: 10 mg via INTRAVENOUS

## 2020-02-09 MED ORDER — LIDOCAINE 2% (20 MG/ML) 5 ML SYRINGE
INTRAMUSCULAR | Status: DC | PRN
  Administered 2020-02-09: 80 mg via INTRAVENOUS

## 2020-02-09 MED ORDER — SODIUM CHLORIDE (PF) 0.9 % IJ SOLN
INTRAMUSCULAR | Status: AC
  Filled 2020-02-09: qty 20

## 2020-02-09 MED ORDER — ROCURONIUM BROMIDE 10 MG/ML (PF) SYRINGE
PREFILLED_SYRINGE | INTRAVENOUS | Status: AC
  Filled 2020-02-09: qty 20

## 2020-02-09 MED ORDER — ROCURONIUM BROMIDE 10 MG/ML (PF) SYRINGE
PREFILLED_SYRINGE | INTRAVENOUS | Status: DC | PRN
  Administered 2020-02-09: 40 mg via INTRAVENOUS
  Administered 2020-02-09 (×2): 10 mg via INTRAVENOUS

## 2020-02-09 MED ORDER — FENTANYL CITRATE (PF) 100 MCG/2ML IJ SOLN: 25.0000 ug | INTRAMUSCULAR | Status: DC | PRN

## 2020-02-09 MED ORDER — FENTANYL CITRATE (PF) 100 MCG/2ML IJ SOLN
INTRAMUSCULAR | Status: DC | PRN
  Administered 2020-02-09: 100 ug via INTRAVENOUS

## 2020-02-09 MED ORDER — TRAMADOL HCL 50 MG PO TABS
50.0000 mg | ORAL_TABLET | Freq: Four times a day (QID) | ORAL | 0 refills | Status: AC | PRN
Start: 2020-02-09 — End: 2020-02-14

## 2020-02-09 MED ORDER — PROPOFOL 10 MG/ML IV BOLUS
INTRAVENOUS | Status: DC | PRN
  Administered 2020-02-09: 160 mg via INTRAVENOUS

## 2020-02-09 MED ORDER — BUPIVACAINE-EPINEPHRINE 0.5% -1:200000 IJ SOLN
INTRAMUSCULAR | Status: DC | PRN
  Administered 2020-02-09: 6 mL

## 2020-02-09 MED ORDER — BUPIVACAINE-EPINEPHRINE 0.5% -1:200000 IJ SOLN
INTRAMUSCULAR | Status: AC
  Filled 2020-02-09: qty 1

## 2020-02-09 MED ORDER — MIDAZOLAM HCL 2 MG/2ML IJ SOLN
INTRAMUSCULAR | Status: AC
  Filled 2020-02-09: qty 2

## 2020-02-09 MED ORDER — MIDAZOLAM HCL 5 MG/5ML IJ SOLN
INTRAMUSCULAR | Status: DC | PRN
  Administered 2020-02-09: 2 mg via INTRAVENOUS

## 2020-02-09 MED ORDER — DEXMEDETOMIDINE (PRECEDEX) IN NS 20 MCG/5ML (4 MCG/ML) IV SYRINGE
PREFILLED_SYRINGE | INTRAVENOUS | Status: AC
  Filled 2020-02-09: qty 5

## 2020-02-09 MED ORDER — SUGAMMADEX SODIUM 200 MG/2ML IV SOLN
INTRAVENOUS | Status: DC | PRN
  Administered 2020-02-09: 200 mg via INTRAVENOUS

## 2020-02-09 MED ORDER — SODIUM CHLORIDE (PF) 0.9 % IJ SOLN
INTRAMUSCULAR | Status: AC
  Filled 2020-02-09: qty 10

## 2020-02-09 MED ORDER — ACETAMINOPHEN 500 MG PO TABS: 1000.0000 mg | ORAL_TABLET | Freq: Once | ORAL | Status: DC

## 2020-02-09 MED ORDER — SUCCINYLCHOLINE CHLORIDE 200 MG/10ML IV SOSY
PREFILLED_SYRINGE | INTRAVENOUS | Status: AC
  Filled 2020-02-09: qty 10

## 2020-02-09 MED ORDER — ACETAMINOPHEN 500 MG PO TABS
ORAL_TABLET | ORAL | Status: AC
  Administered 2020-02-09: 1000 mg via ORAL
  Filled 2020-02-09: qty 2

## 2020-02-09 MED ORDER — PROMETHAZINE HCL 25 MG/ML IJ SOLN: 6.2500 mg | INTRAMUSCULAR | Status: DC | PRN

## 2020-02-09 MED ORDER — DIBUCAINE (PERIANAL) 1 % EX OINT
TOPICAL_OINTMENT | CUTANEOUS | Status: AC
  Filled 2020-02-09: qty 28

## 2020-02-09 SURGICAL SUPPLY — 34 items
BENZOIN TINCTURE PRP APPL 2/3 (GAUZE/BANDAGES/DRESSINGS) ×2 IMPLANT
BLADE SURG 15 STRL LF DISP TIS (BLADE) IMPLANT
BLADE SURG 15 STRL SS (BLADE)
BRIEF STRETCH FOR OB PAD LRG (UNDERPADS AND DIAPERS) ×2 IMPLANT
CNTNR URN SCR LID CUP LEK RST (MISCELLANEOUS) ×1 IMPLANT
CONT SPEC 4OZ STRL OR WHT (MISCELLANEOUS) ×1
COVER SURGICAL LIGHT HANDLE (MISCELLANEOUS) ×2 IMPLANT
COVER WAND RF STERILE (DRAPES) IMPLANT
DECANTER SPIKE VIAL GLASS SM (MISCELLANEOUS) ×2 IMPLANT
DRAPE LAPAROTOMY T 102X78X121 (DRAPES) ×2 IMPLANT
DRSG PAD ABDOMINAL 8X10 ST (GAUZE/BANDAGES/DRESSINGS) ×2 IMPLANT
ELECT REM PT RETURN 15FT ADLT (MISCELLANEOUS) ×2 IMPLANT
GAUZE 4X4 16PLY RFD (DISPOSABLE) ×2 IMPLANT
GAUZE SPONGE 4X4 12PLY STRL (GAUZE/BANDAGES/DRESSINGS) ×2 IMPLANT
GLOVE BIO SURGEON STRL SZ7.5 (GLOVE) ×2 IMPLANT
GLOVE INDICATOR 8.0 STRL GRN (GLOVE) ×2 IMPLANT
GOWN STRL REUS W/TWL XL LVL3 (GOWN DISPOSABLE) ×4 IMPLANT
KIT BASIN OR (CUSTOM PROCEDURE TRAY) ×2 IMPLANT
KIT TURNOVER KIT A (KITS) IMPLANT
LOOP VESSEL MAXI BLUE (MISCELLANEOUS) IMPLANT
NEEDLE HYPO 22GX1.5 SAFETY (NEEDLE) ×2 IMPLANT
PACK BASIC VI WITH GOWN DISP (CUSTOM PROCEDURE TRAY) ×2 IMPLANT
PENCIL SMOKE EVACUATOR (MISCELLANEOUS) IMPLANT
SHEARS HARMONIC 9CM CVD (BLADE) IMPLANT
SURGILUBE 2OZ TUBE FLIPTOP (MISCELLANEOUS) ×2 IMPLANT
SUT CHROMIC 2 0 SH (SUTURE) ×2 IMPLANT
SUT CHROMIC 3 0 SH 27 (SUTURE) IMPLANT
SUT VIC AB 2-0 SH 27 (SUTURE)
SUT VIC AB 2-0 SH 27X BRD (SUTURE) IMPLANT
SUT VIC AB 2-0 UR6 27 (SUTURE) ×12 IMPLANT
SYR 20ML LL LF (SYRINGE) ×2 IMPLANT
SYR 3ML LL SCALE MARK (SYRINGE) IMPLANT
TOWEL OR 17X26 10 PK STRL BLUE (TOWEL DISPOSABLE) ×2 IMPLANT
TOWEL OR NON WOVEN STRL DISP B (DISPOSABLE) ×2 IMPLANT

## 2020-02-09 NOTE — Op Note (Signed)
02/09/2020  1:06 PM  PATIENT:  Jay Ward  49 y.o. male  Patient Care Team: Tammi Sou, MD as PCP - General (Family Medicine) Druscilla Brownie, MD as Consulting Physician (Dermatology) Ileana Roup, MD as Consulting Physician (General Surgery)  PRE-OPERATIVE DIAGNOSIS:  Anorectal polypoid lesion  POST-OPERATIVE DIAGNOSIS:  Same  PROCEDURE:  1. Transanal excision of anorectal polypoid lesion - 2.5 x 2.5 cm in size 2. Anorectal exam under anesthesia  SURGEON:  Surgeon(s): Ileana Roup, MD  ASSISTANT: OR staff   ANESTHESIA:   local and general  SPECIMEN:   1. Anorectal polypoid lesion - oriented on cork board and personally walked to pathology 2. Additional anterior margin  DISPOSITION OF SPECIMEN:  PATHOLOGY  COUNTS:  Sponge, needle, and instrument counts were reported correct x2 at conclusion.  EBL: 20 mL  PLAN OF CARE: Discharge to home after PACU  PATIENT DISPOSITION:  PACU - hemodynamically stable.  OR FINDINGS: Left anterior lateral and rectal polypoid lesion located at the level of the dentate line and extending proximally into the distalmost aspect of the rectum.  This was removed in 1 piece.  There was a small tenting of internal sphincter muscle at the central portion of this that was included with the specimen.  Otherwise, there is no evidence that this extended into the sphincter complex.  Defect was closed transversely.  Specimen was oriented:  Distal black, proximal Makale [place], posterior (closest to sacrum)-yellow, anterior (closest to prostate) green.  Additional anterior margin was submitted separately as well.  I walked the specimen down to pathology and discussed this orientation directly with the pathology PA.  DESCRIPTION: The patient was identified in the preoperative holding area and taken to the OR. SCDs were applied. He then underwent general endotracheal anesthesia without difficulty. The patient was then rolled onto the OR  table in the prone jackknife position. Pressure points were then evaluated and padded. Benzoin was applied to the buttocks and they were gently taped apart.  He was then prepped and draped in usual sterile fashion.  A surgical timeout was performed indicating the correct patient, procedure, and positioning.  A perianal block was then created using a dilute mixture of 0.25% Marcaine with epinephrine and Exparel.  After ascertaining an appropriate level of anesthesia had been achieved, a well lubricated digital rectal exam was performed. This demonstrated the palpable lesion in the left anterolateral anorectal canal.  A Hill-Ferguson anoscope was into the anal canal and circumferential inspection demonstrated normal-appearing anoderm, polypoid lesion is noted.  This is soft and mobile.  This is approximately 2.5 x 2.5 cm in size.  No gross evidence of any malignancy per se.  This extended distally to the level of the dentate and proximally into the distalmost aspect of the rectum.  With the Hill-Ferguson retractor in place, margins were marked with electrocautery including nothing but normal rectal mucosa.  Using cautery, the entire lesion was lifted and excised in 1 piece.  At the central aspect of this, there were a few fibers of internal sphincter muscle tenting up to this - these were therefore included.  Maintaining orientation, this was then transferred to a cork board.  I then marked the specimen orienting it as noted above.  The anterior margin was close and therefore additional anterior margin was taken with cautery and submitted separately as specimen.  The final edge of this was marked with a suture.  The excision site was reinspected.  Hemostasis was achieved with electrocautery.  Closing this in  a longitudinal fashion would result in an hourglass type deformity or stenosis of the anal canal.  Therefore, this was closed transversely.  2-0 Vicryl sutures were used to bring the distal rectal mucosa down  to the level of the anal canal just distal to the dentate line.  The anal canal was reinspected and noted to be hemostatic.  This was then irrigated.  A piece of Surgifoam was placed.  Additional local anesthetic was infiltrated circumferentially in the perianal skin.  Sponge, needle, and instrument counts were reported correct x2.  A dressing consisting of 4 x 4's, ABD, and mesh underwear were placed.  He was then transferred off of the table onto a stretcher, awakened from anesthesia, extubated, and transferred to PACU in satisfactory condition.  I then walked the specimens down to pathology to discuss orientation with the pathology PA.  DISPOSITION: PACU in satisfactory condition.

## 2020-02-09 NOTE — Anesthesia Procedure Notes (Signed)
Procedure Name: Intubation Date/Time: 02/09/2020 11:45 AM Performed by: Lavina Hamman, CRNA Pre-anesthesia Checklist: Patient identified, Emergency Drugs available, Suction available, Patient being monitored and Timeout performed Patient Re-evaluated:Patient Re-evaluated prior to induction Oxygen Delivery Method: Circle system utilized Preoxygenation: Pre-oxygenation with 100% oxygen Induction Type: IV induction Ventilation: Mask ventilation without difficulty Laryngoscope Size: Mac and 4 Grade View: Grade I Tube type: Oral Tube size: 7.5 mm Number of attempts: 1 Airway Equipment and Method: Stylet Placement Confirmation: ETT inserted through vocal cords under direct vision,  positive ETCO2,  CO2 detector and breath sounds checked- equal and bilateral Secured at: 22 cm Tube secured with: Tape Dental Injury: Teeth and Oropharynx as per pre-operative assessment  Comments: ATOI

## 2020-02-09 NOTE — Transfer of Care (Signed)
Immediate Anesthesia Transfer of Care Note  Patient: Jay Ward  Procedure(s) Performed: Procedure(s): anorectal exam under anesthesia  excision left anorectal polpoid lesion (N/A) RECTAL EXAM UNDER ANESTHESIA (N/A)  Patient Location: PACU  Anesthesia Type:General  Level of Consciousness:  sedated, patient cooperative and responds to stimulation  Airway & Oxygen Therapy:Patient Spontanous Breathing and Patient connected to face mask oxgen  Post-op Assessment:  Report given to PACU RN and Post -op Vital signs reviewed and stable  Post vital signs:  Reviewed and stable  Last Vitals:  Vitals:   02/09/20 1313 02/09/20 1315  BP: 120/75 120/78  Pulse: 80 (!) 58  Resp: 15 19  Temp: 36.4 C   SpO2: 068% 934%    Complications: No apparent anesthesia complications

## 2020-02-09 NOTE — Anesthesia Postprocedure Evaluation (Signed)
Anesthesia Post Note  Patient: Jay Ward  Procedure(s) Performed: anorectal exam under anesthesia  excision left anorectal polpoid lesion (N/A Rectum) RECTAL EXAM UNDER ANESTHESIA (N/A Rectum)     Patient location during evaluation: PACU Anesthesia Type: General Level of consciousness: sedated Pain management: pain level controlled Vital Signs Assessment: post-procedure vital signs reviewed and stable Respiratory status: spontaneous breathing and respiratory function stable Cardiovascular status: stable Postop Assessment: no apparent nausea or vomiting Anesthetic complications: no   No complications documented.  Last Vitals:  Vitals:   02/09/20 1330 02/09/20 1345  BP: 119/85 122/80  Pulse: 62 (!) 42  Resp: 13   Temp:    SpO2: 98% 100%    Last Pain:  Vitals:   02/09/20 1330  TempSrc:   PainSc: 2                  Theseus Birnie DANIEL

## 2020-02-09 NOTE — Discharge Instructions (Signed)
ANORECTAL SURGERY: POST OP INSTRUCTIONS ° °1. DIET: Follow a light bland diet the first 24 hours after arrival home, such as soup, liquids, crackers, etc.  Be sure to include lots of fluids daily.  Avoid fast food or heavy meals as your are more likely to get nauseated.  Eat a low fat diet the next few days after surgery.   °2. Some bleeding with bowel movements is expected for the first couple of days but this should stop in between bowel movements ° °3. Take your usually prescribed home medications unless otherwise directed. ° °4. PAIN CONTROL: °a. It is helpful to take an over-the-counter pain medication regularly for the first few days/weeks.  Choose from the following that works best for you: °i. Ibuprofen (Advil, etc) Three 200mg tabs every 6 hours as needed. °ii. Acetaminophen (Tylenol, etc) 500-650mg every 6 hours as needed °iii. NOTE: You may take both of these medications together - most patients find it most helpful when alternating between the two (i.e. Ibuprofen at 6am, tylenol at 9am, ibuprofen at 12pm ...) °b. A  prescription for pain medication may have been prescribed for you at discharge.  Take your pain medication as prescribed.  °i. If you are having problems/concerns with the prescription medicine, please call us for further advice. ° °5. Avoid getting constipated.  Between the surgery and the pain medications, it is common to experience some constipation.  Increasing fluid intake (64oz of water per day) and taking a fiber supplement (such as Metamucil, Citrucel, FiberCon) 1-2 times a day regularly will usually help prevent this problem from occurring.  Take Miralax (over the counter) 1-2x/day while taking a narcotic pain medication. If no bowel movement after 48hours, you may additionally take a laxative like a bottle of Milk of Magnesia which can be purchased over the counter. Avoid enemas if possible as these are often painful. °  °6. Watch out for diarrhea.  If you have many loose bowel  movements, simplify your diet to bland foods.  Stop any stool softeners and decrease your fiber supplement. If this worsens or does not improve, please call us. ° °7. Wash / shower every day.  If you were discharged with a dressing, you may remove this the day after your surgery. You may shower normally, getting soap/water on your wound, particularly after bowel movements. ° °8. Soaking in a warm bath filled a couple inches ("Sitz bath") is a great way to clean the area after a bowel movement and many patients find it is a way to soothe the area. ° °9. ACTIVITIES as tolerated:   °a. You may resume regular (light) daily activities beginning the next day--such as daily self-care, walking, climbing stairs--gradually increasing activities as tolerated.  If you can walk 30 minutes without difficulty, it is safe to try more intense activity such as jogging, treadmill, bicycling, low-impact aerobics, etc. °b. Refrain from any heavy lifting or straining for the first 2 weeks after your procedure, particularly if your surgery was for hemorrhoids. °c. Avoid activities that make your pain worse °d. You may drive when you are no longer taking prescription pain medication, you can comfortably wear a seatbelt, and you can safely maneuver your car and apply brakes. ° °10. FOLLOW UP in our office °a. Please call [facility] at [phone] to set up an appointment to see your surgeon in the office for a follow-up appointment approximately 2 weeks after your surgery. °b. Make sure that you call for this appointment the day you arrive   home to insure a convenient appointment time. ° °9. If you have disability or family leave forms that need to be completed, you may have them completed by your primary care physician's office; for return to work instructions, please ask our office staff and they will be happy to assist you in obtaining this documentation °  °When to call us [phone]: °1. Poor pain control °2. Reactions / problems with  new medications (rash/itching, etc)  °3. Fever over 101.5 F (38.5 C) °4. Inability to urinate °5. Nausea/vomiting °6. Worsening swelling or bruising °7. Continued bleeding from incision. °8. Increased pain, redness, or drainage from the incision ° °The clinic staff is available to answer your questions during regular business hours (8:30am-5pm).  Please don’t hesitate to call and ask to speak to one of our nurses for clinical concerns.   A surgeon from [facility] is always on call at the hospitals °  °If you have a medical emergency, go to the nearest emergency room or call 911. °  °[facility] °[address], [address], [place], [place]  [address] ? °MAIN: [phone] °FAX [phone] °[email or web address] °

## 2020-02-09 NOTE — Anesthesia Preprocedure Evaluation (Addendum)
Anesthesia Evaluation  Patient identified by MRN, date of birth, ID band Patient awake    Reviewed: Allergy & Precautions, NPO status , Patient's Chart, lab work & pertinent test results  History of Anesthesia Complications Negative for: history of anesthetic complications  Airway Mallampati: II  TM Distance: >3 FB Neck ROM: Full    Dental no notable dental hx. (+) Dental Advisory Given   Pulmonary neg pulmonary ROS, former smoker,    Pulmonary exam normal        Cardiovascular negative cardio ROS Normal cardiovascular exam     Neuro/Psych negative neurological ROS     GI/Hepatic Neg liver ROS,   Endo/Other  negative endocrine ROS  Renal/GU negative Renal ROS     Musculoskeletal negative musculoskeletal ROS (+)   Abdominal   Peds  Hematology negative hematology ROS (+)   Anesthesia Other Findings   Reproductive/Obstetrics                            Anesthesia Physical Anesthesia Plan  ASA: II  Anesthesia Plan: General   Post-op Pain Management:    Induction: Intravenous  PONV Risk Score and Plan: 3 and Ondansetron, Dexamethasone and Midazolam  Airway Management Planned: LMA  Additional Equipment:   Intra-op Plan:   Post-operative Plan: Extubation in OR  Informed Consent: I have reviewed the patients History and Physical, chart, labs and discussed the procedure including the risks, benefits and alternatives for the proposed anesthesia with the patient or authorized representative who has indicated his/her understanding and acceptance.     Dental advisory given  Plan Discussed with: Anesthesiologist and CRNA  Anesthesia Plan Comments:        Anesthesia Quick Evaluation

## 2020-02-09 NOTE — Interval H&P Note (Signed)
History and Physical Interval Note:  02/09/2020 10:45 AM  Jay Ward  has presented today for surgery, with the diagnosis of rectal poly with dysplasia.  The various methods of treatment have been discussed with the patient and family. After consideration of risks, benefits and other options for treatment, the patient has consented to  Procedure(s): TRANSANAL EXCISION OF RECTAL POLYPOID LESION (N/A) RECTAL EXAM UNDER ANESTHESIA (N/A) as a surgical intervention.  The patient's history has been reviewed, patient examined, no change in status, stable for surgery.  I have reviewed the patient's chart and labs.  Questions were answered to the patient's satisfaction.     Nadeen Landau, MD [facility] Use [email or web address] to contact on call provider

## 2020-02-10 ENCOUNTER — Encounter (HOSPITAL_COMMUNITY): Payer: Self-pay | Admitting: Surgery

## 2020-02-15 LAB — SURGICAL PATHOLOGY

## 2020-02-29 ENCOUNTER — Other Ambulatory Visit: Payer: Self-pay

## 2020-02-29 ENCOUNTER — Other Ambulatory Visit: Payer: Self-pay | Admitting: Surgery

## 2020-02-29 ENCOUNTER — Other Ambulatory Visit (HOSPITAL_COMMUNITY): Payer: Self-pay | Admitting: Surgery

## 2020-02-29 DIAGNOSIS — C20 Malignant neoplasm of rectum: Secondary | ICD-10-CM

## 2020-03-01 ENCOUNTER — Other Ambulatory Visit: Payer: Self-pay

## 2020-03-01 ENCOUNTER — Ambulatory Visit (HOSPITAL_COMMUNITY)
Admission: RE | Admit: 2020-03-01 | Discharge: 2020-03-01 | Disposition: A | Discharge status: Home / Self Care | Payer: BC Managed Care – PPO | Source: Ambulatory Visit | Attending: Surgery | Admitting: Surgery

## 2020-03-01 DIAGNOSIS — K639 Disease of intestine, unspecified: Secondary | ICD-10-CM | POA: Diagnosis not present

## 2020-03-01 DIAGNOSIS — R911 Solitary pulmonary nodule: Secondary | ICD-10-CM | POA: Diagnosis not present

## 2020-03-01 DIAGNOSIS — R918 Other nonspecific abnormal finding of lung field: Secondary | ICD-10-CM | POA: Diagnosis not present

## 2020-03-01 DIAGNOSIS — K7689 Other specified diseases of liver: Secondary | ICD-10-CM | POA: Diagnosis not present

## 2020-03-01 DIAGNOSIS — K6289 Other specified diseases of anus and rectum: Secondary | ICD-10-CM | POA: Diagnosis not present

## 2020-03-01 DIAGNOSIS — C20 Malignant neoplasm of rectum: Secondary | ICD-10-CM | POA: Insufficient documentation

## 2020-03-01 MED ORDER — IOHEXOL 300 MG/ML  SOLN
100.0000 mL | Freq: Once | INTRAMUSCULAR | Status: AC | PRN
  Administered 2020-03-01: 100 mL via INTRAVENOUS

## 2020-03-06 ENCOUNTER — Inpatient Hospital Stay: Payer: BC Managed Care – PPO

## 2020-03-06 ENCOUNTER — Inpatient Hospital Stay: Payer: BC Managed Care – PPO | Attending: Genetic Counselor | Admitting: Genetic Counselor

## 2020-03-06 ENCOUNTER — Encounter: Payer: Self-pay | Admitting: Genetic Counselor

## 2020-03-06 ENCOUNTER — Other Ambulatory Visit: Payer: Self-pay

## 2020-03-06 DIAGNOSIS — Z808 Family history of malignant neoplasm of other organs or systems: Secondary | ICD-10-CM

## 2020-03-06 DIAGNOSIS — Z86010 Personal history of colonic polyps: Secondary | ICD-10-CM

## 2020-03-06 DIAGNOSIS — C4491 Basal cell carcinoma of skin, unspecified: Secondary | ICD-10-CM

## 2020-03-06 DIAGNOSIS — Z1379 Encounter for other screening for genetic and chromosomal anomalies: Secondary | ICD-10-CM

## 2020-03-06 DIAGNOSIS — C439 Malignant melanoma of skin, unspecified: Secondary | ICD-10-CM

## 2020-03-06 DIAGNOSIS — Z8042 Family history of malignant neoplasm of prostate: Secondary | ICD-10-CM | POA: Insufficient documentation

## 2020-03-06 DIAGNOSIS — Z800 Family history of malignant neoplasm of digestive organs: Secondary | ICD-10-CM | POA: Diagnosis not present

## 2020-03-06 DIAGNOSIS — C20 Malignant neoplasm of rectum: Secondary | ICD-10-CM

## 2020-03-06 DIAGNOSIS — Z8051 Family history of malignant neoplasm of kidney: Secondary | ICD-10-CM

## 2020-03-06 NOTE — Progress Notes (Signed)
REFERRING PROVIDER: Ileana Roup, MD [place],  [place] [address]  PRIMARY PROVIDER:  Tammi Sou, MD  PRIMARY REASON FOR VISIT:  1. Rectal cancer ([place])   2. History of colon polyps   3. Malignant melanoma, unspecified site ([place])   4. Basal cell carcinoma ([facility]), unspecified site   5. Family history of prostate cancer in father   68. Family history of colon cancer   7. Family history of kidney cancer   8. Family history of basal cell carcinoma       HISTORY OF PRESENT ILLNESS:   Jay Ward, a 49 y.o. male, was seen for a [facility] cancer genetics consultation at the request of Dr. Dema Severin due to a personal and family history of cancer and colon polyps.  Jay Ward presents to clinic today to discuss the possibility of a hereditary predisposition to cancer, genetic testing, and to further clarify his future cancer risks, as well as potential cancer risks for family members.   Jay Ward has a personal history of multiple cancers. In 2003, at the age of 28/31, Jay Ward was diagnosed with testicular cancer. In 2014, at the age of [age], he was diagnosed with melanoma. Earlier this year (2021) at the age of 66 he was diagnosed with a basal cell carcinoma. Most recently in October of 2021, he underwent a colonoscopy which revealed 14 adenomatous polyps, one of which was an anorectal polypoid lesion with high-grade dysplasia. Excision of this lesion in November of 2021 revealed rectal adenocarcinoma that is microsatellite stable with mismatch repair protein IHC that is intact (normal).    Past Medical History:  Diagnosis Date  . Allergy    seasonal  . Basal cell carcinoma 10/12/2019  . Cancer of multiple primary sites [facility])    genetics referral by gen surg 01/17/20  . Complication of anesthesia    Can not have 100% oxygen due to previous chemo tx  . Family history of basal cell carcinoma   . Family history of colon cancer   . Family history of  kidney cancer   . Family history of prostate cancer in father   . Hay fever   . History of colon polyps    multiple + large lesion in anorectal area--surg eval/imaging->malignancy not suspected->gen surg to excise this lesion as of 12/2019 eval  . Inguinal hernia    Right  . Melanoma ([place]) 2014   on his back.  Also R thigh with dysplastic nevus with moderate atypia 02/2016.  He sees Derm (Dr. Allyson Sabal) q 6 mo: as of 08/2017 derm f/u [facility].  . Testicular cancer [facility]) 2003   orchiectomy + chemo--he has been released from f/u by urology due to long term remission status.    Past Surgical History:  Procedure Laterality Date  . APPENDECTOMY  1990  . COLONOSCOPY  12/2019  . INGUINAL HERNIA REPAIR Right   . MELANOMA EXCISION     with lymph nodes  . MOHS SURGERY    . ORCHIECTOMY Left 2003  . RECTAL EXAM UNDER ANESTHESIA N/A 02/09/2020   Procedure: RECTAL EXAM UNDER ANESTHESIA;  Surgeon: Ileana Roup, MD;  Location: [facility];  Service: General;  Laterality: N/A;  . TRANSANAL EXCISION OF RECTAL MASS N/A 02/09/2020   Procedure: anorectal exam under anesthesia  excision left anorectal polpoid lesion;  Surgeon: Ileana Roup, MD;  Location: [facility];  Service: General;  Laterality: N/A;    Social History   Socioeconomic History  .  Marital status: Married    Spouse name: Not on file  . Number of children: Not on file  . Years of education: Not on file  . Highest education level: Not on file  Occupational History  . Not on file  Tobacco Use  . Smoking status: Former Smoker    Packs/day: 0.25    Years: 10.00    Pack years: 2.50    Types: Cigarettes    Quit date: 03/31/2001    Years since quitting: 18.9  . Smokeless tobacco: Never Used  Vaping Use  . Vaping Use: Never used  Substance and Sexual Activity  . Alcohol use: Yes    Alcohol/week: 6.0 standard drinks    Types: 6 Cans of beer per week  . Drug use: No  . Sexual activity: Not on file  Other Topics Concern  . Not  on file  Social History Narrative   Married, no children.   Orig from [place].   Educ: BS   Occup: Banking   Minimal tob use in the past--quit 2003.   Alc: rare.    Social Determinants of Health   Financial Resource Strain:   . Difficulty of Paying Living Expenses: Not on file  Food Insecurity:   . Worried About Charity fundraiser in the Last Year: Not on file  . Ran Out of Food in the Last Year: Not on file  Transportation Needs:   . Lack of Transportation (Medical): Not on file  . Lack of Transportation (Non-Medical): Not on file  Physical Activity:   . Days of Exercise per Week: Not on file  . Minutes of Exercise per Session: Not on file  Stress:   . Feeling of Stress : Not on file  Social Connections:   . Frequency of Communication with Friends and Family: Not on file  . Frequency of Social Gatherings with Friends and Family: Not on file  . Attends Religious Services: Not on file  . Active Member of Clubs or Organizations: Not on file  . Attends Archivist Meetings: Not on file  . Marital Status: Not on file     FAMILY HISTORY:  We obtained a detailed, 4-generation family history.  Significant diagnoses are listed below: Family History  Problem Relation Age of Onset  . Basal cell carcinoma Mother        several  . Prostate cancer Father        dx 101s  . Hyperlipidemia Father   . Hypertension Father   . Colon polyps Father 38       unknown number  . Kidney cancer Father        dx 27s  . Colon cancer Paternal Aunt        dx in early 36's  . Heart disease Maternal Grandfather   . Stroke Maternal Grandfather   . Colon cancer Paternal Grandmother        Dx in her 47's  . Colon polyps Brother        <10  . Alzheimer's disease Paternal Grandfather   . Esophageal cancer Neg Hx   . Rectal cancer Neg Hx   . Stomach cancer Neg Hx    Jay Ward does not have children. He has two brothers (ages 85 and 46). His older brother has had colon polyps  diagnosed on colonoscopy (fewer than than 10).   Jay Ward's mother is 26 and has a history of several basal cell carcinomas. He had three maternal uncles. His maternal grandmother  died at age 44 and his maternal grandfather died in his 20s. There are no other known diagnoses of cancer on the maternal side of the family.  Jay Ward's father is 54 and has a history of prostate cancer and kidney cancer diagnosed in his 54s, as well as a history of colon polyps (unknown cumulative number). Jay Ward had two paternal aunts. One aunt died in her 24s from colon cancer. His paternal grandmother died in her 40s from colon cancer, and his paternal grandfather died in his 21s from a car accident and did not have cancer.   Jay Ward is unaware of previous family history of genetic testing for hereditary cancer risks. Patient's maternal ancestors are of [place] descent, and paternal ancestors are of [place] descent. There is Jewish ancestry on the paternal side of the family, although he is unsure if this is Ashkenazi Jewish ancestry. There is no known consanguinity.  GENETIC COUNSELING ASSESSMENT: Jay Ward is a 50 y.o. male with a personal and family history of colorectal cancer and colon polyps, which is somewhat suggestive of a hereditary cancer/polyposis syndrome and predisposition to cancer. We, therefore, discussed and recommended the following at today's visit.   DISCUSSION:  We discussed that polyps in general are common, however, most people have fewer than 5 lifetime polyps. When an individual has 10 or more polyps we become concerned about an underlying polyposis syndrome. The most common hereditary polyposis syndromes are Familial Adenomatous Polyposis (FAP), caused by mutations in the APC gene, and MUTYH-Associated Polyposis (MAP), caused by mutations in the MUTYH gene. There are other genes that are associated with polyposis, such as NTHL1 and MSH3. We discussed that testing is  beneficial for several reasons, including knowing about cancer risks, identifying potential screening and risk-reduction options that may be appropriate, and to understand if other family members could be at risk for colon polyps and/or cancer and allow them to undergo genetic testing.  We reviewed the characteristics, features and inheritance patterns of hereditary polyposis/cancer syndromes. We also discussed genetic testing, including the appropriate family members to test, the process of testing, insurance coverage and turn-around-time for results. We discussed the implications of a negative, positive and/or variant of uncertain significant result. We recommended Jay Ward pursue genetic testing for the [facility] CustomNext-Cancer + RNAinsight panel.  The CustomNext-Cancer+RNAinsight panel offered by [facility] includes sequencing and rearrangement analysis for the following 47 genes:  APC, ATM, AXIN2, BARD1, BMPR1A, BRCA1, BRCA2, BRIP1, CDH1, CDK4, CDKN2A, CHEK2, DICER1, EPCAM, GREM1, HOXB13, MEN1, MLH1, MSH2, MSH3, MSH6, MUTYH, NBN, NF1, NF2, NTHL1, PALB2, PMS2, POLD1, POLE, PTEN, RAD51C, RAD51D, RECQL, RET, SDHA, SDHAF2, SDHB, SDHC, SDHD, SMAD4, SMARCA4, STK11, TP53, TSC1, TSC2, and VHL.  RNA data is routinely analyzed for use in variant interpretation for all genes.  Based on Jay Ward's personal and family history of cancer and colon polyps, he meets medical criteria for genetic testing. Despite that he meets criteria, he may still have an out of pocket cost. We discussed that if his out of pocket cost for testing is over $100, the laboratory will reach out to let him know. If the out of pocket cost of testing is less than $100 he will be billed by the genetic testing laboratory.   We discussed that some people do not want to undergo genetic testing due to fear of genetic discrimination. A federal law called the Genetic Information Non-Discrimination Act (GINA) of 2008 helps protect  individuals against genetic discrimination based on their genetic test results. It  impacts both health insurance and employment. With health insurance, it protects against increased premiums, being kicked off insurance or being forced to take a test in order to be insured. For employment it protects against hiring, firing and promoting decisions based on genetic test results. Health status due to a cancer diagnosis is not protected under GINA. It is important to note that life, disability, and long-term care insurance is not protected under GINA.   PLAN: After considering the risks, benefits, and limitations, Jay Ward provided informed consent to pursue genetic testing and the blood sample was sent to [facility] for analysis of the CustomNext-Cancer + RNAinsight panel. Results should be available within approximately two-three weeks' time, at which point they will be disclosed by telephone to Jay Ward, as will any additional recommendations warranted by these results. Jay Ward will receive a summary of his genetic counseling visit and a copy of his results once available. This information will also be available in Epic.   Jay Ward's questions were answered to his satisfaction today. Our contact information was provided should additional questions or concerns arise. Thank you for the referral and allowing [place] to share in the care of your patient.   Clint Guy, [place], [facility] Licensed, Certified Dispensing [email or web address] Phone: [phone]  The patient was seen for a total of 40 minutes in face-to-face genetic counseling.  This patient was discussed with Drs. Magrinat, Lindi Adie and/or Burr Medico who agrees with the above.    _______________________________________________________________________ For Office Staff:  Number of people involved in session: 1 Was an Intern/ student involved with case: no

## 2020-03-27 DIAGNOSIS — Z1379 Encounter for other screening for genetic and chromosomal anomalies: Secondary | ICD-10-CM | POA: Insufficient documentation

## 2020-03-28 ENCOUNTER — Encounter: Payer: Self-pay | Admitting: Genetic Counselor

## 2020-03-28 ENCOUNTER — Telehealth: Payer: Self-pay | Admitting: Genetic Counselor

## 2020-03-28 ENCOUNTER — Ambulatory Visit: Payer: Self-pay | Admitting: Genetic Counselor

## 2020-03-28 DIAGNOSIS — Z1379 Encounter for other screening for genetic and chromosomal anomalies: Secondary | ICD-10-CM

## 2020-03-28 NOTE — Progress Notes (Signed)
GENETIC TEST RESULTS   Patient Name: Jay Ward Patient Age: 49 y.o. Encounter Date: 03/28/2020  Referring Provider: Ileana Roup, MD [place],  [place] [id number]       Jay Ward was seen in the [facility] on 03/06/2020 due to a personal and family history of colon polyps and cancer and concern regarding a hereditary predisposition to cancer in the family. Please refer to the prior Genetics clinic note for more information regarding Jay Ward's medical and family histories and our assessment at the time.    FAMILY HISTORY:  We obtained a detailed, 4-generation family history.  Significant diagnoses are listed below:  Family History  Problem Relation Age of Onset  . Basal cell carcinoma Mother        several  . Prostate cancer Father        dx 77s  . Hyperlipidemia Father   . Hypertension Father   . Colon polyps Father 20       unknown number  . Kidney cancer Father        dx 25s  . Colon cancer Paternal Aunt        dx in early 34's  . Heart disease Maternal Grandfather   . Stroke Maternal Grandfather   . Colon cancer Paternal Grandmother        Dx in her 56's  . Colon polyps Brother        <10  . Alzheimer's disease Paternal Grandfather   . Esophageal cancer Neg Hx   . Rectal cancer Neg Hx   . Stomach cancer Neg Hx    Jay Ward does not have children. He has two brothers (ages 21 and 30). His older brother has had colon polyps diagnosed on colonoscopy (fewer than than 10).   Jay Ward's mother is 51 and has a history of several basal cell carcinomas. He had three maternal uncles. His maternal grandmother died at age 64 and his maternal grandfather died in his 60s. There are no other known diagnoses of cancer on the maternal side of the family.  Jay Ward's father is 36 and has a history of prostate cancer and kidney cancer diagnosed in his 63s, as well as a history of colon polyps (unknown cumulative number). Mr.  Ward had two paternal aunts. One aunt died in her 42s from colon cancer. His paternal grandmother died in her 68s from colon cancer, and his paternal grandfather died in his 23s from a car accident and did not have cancer.   Jay Ward is unaware of previous family history of genetic testing for hereditary cancer risks. Patient's maternal ancestors are of [place] descent, and paternal ancestors are of [place] descent. There is Jewish ancestry on the paternal side of the family, although he is unsure if this is Ashkenazi Jewish ancestry. There is no known consanguinity.   GENETIC TESTING:  Genetic testing reported out on 03/27/2020 through the [facility]Next-Cancer + RNAinsight panel. 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   The APC I1307K mutation is a well-described moderate-risk mutation that is not associated with Familial Adenomatous Polyposis, but does confer a moderately-increased lifetime risk for colorectal cancer.  The CustomNext-Cancer+RNAinsight panel offered by [facility] includes sequencing and rearrangement analysis for the following 47 genes:  APC, ATM, AXIN2, BARD1, BMPR1A, BRCA1, BRCA2, BRIP1, CDH1, CDK4, CDKN2A, CHEK2, DICER1, EPCAM, GREM1, HOXB13, MEN1, MLH1, MSH2, MSH3, MSH6, MUTYH, NBN, NF1, NF2, NTHL1, PALB2, PMS2, POLD1, POLE, PTEN, RAD51C, RAD51D,  RECQL, RET, SDHA, SDHAF2, SDHB, SDHC, SDHD, SMAD4, SMARCA4, STK11, TP53, TSC1, TSC2, and VHL.  RNA data is routinely analyzed for use in variant interpretation for all genes. The test report will be scanned into EPIC and located under the Molecular Pathology section of the Results Review tab.  A portion of the result report is included below for reference.        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 At this time, it is unknown if this variant is associated with increased cancer risk or if this is a normal  finding, but most variants such as this get reclassified to being inconsequential. It should not be used to make medical management decisions. With time, we suspect the lab will determine the significance of this variant, if any. If we do learn more about it, we will try to contact Jay Ward to discuss it further. However, it is important to stay in touch with [place] periodically and keep the address and phone number up to date.  APC I1307K CANCER RISKS & RECOMMENDATIONS:  The I1307K mutation in the APC gene is considered to be a founder mutation in the [place] population. Approximately 6-8% of the Ashkenazi Jewish population are carriers of this mutation, although individuals of non-Jewish ancestry can also have this mutation.   Importantly, the I1307K mutation is different than other mutations in the APC gene. Most mutations in the APC gene cause a rare genetic condition called Familial Adenomatous Polyposis (FAP), which is characterized by a very high risk for colorectal cancer (up to 100%) and the development of hundreds to thousands of colon polyps. The APC I1307K mutation does not cause FAP.  The APC I1307K mutation is considered a "moderate risk mutation", with studies showing that it increases the risk to develop colorectal cancer by approximately 2-fold. Therefore, it is recommended that carriers of this mutation have more frequent colorectal cancer screening beginning at younger ages than the general population. Current NCCN Guidelines (Genetic/Familial High-Risk Assessment: Colorectal, Version 1.2021) recommend the following management for APC I1307K carriers:     For individuals affected by colorectal cancer:  Follow surveillance recommendations made by GI/oncology providers for post-colorectal cancer resection.  For individuals unaffected by colorectal cancer with a first-degree relative with colorectal cancer:  High-quality colonoscopy every 5 years beginning at age 47, or 57 years  younger than the earliest age of colorectal cancer onset in a first-degree relative, whichever is younger.  For individuals unaffected by colorectal cancer and no first-degree relative affected by colorectal cancer:  High-quality colonoscopy screening every 5 years beginning at age 60.   FAMILY MEMBERS:  Mr. Beauregard's siblings each have a 50% (1 in 2) chance to have inherited the same APC mutation. It is recommended that these family members be offered genetic testing to determine if they need to undergo increased surveillance for colon cancer. We would be happy to help meet with and coordinate genetic testing for any relative that is interested.   Lastly, we discussed that our knowledge of cancer risks related to APC I1307K mutations will likely continue to evolve. We recommended that Mr. Bochenek follow up with the genetics clinic annually so we can provide him with the most current information about APC I1307K and cancer risk, as well as with any changes to his family history (e.g. new cancer diagnoses, genetic test results).  Our contact number was provided. Mr. Campoy's questions were answered to his satisfaction, and he knows  he is welcome to call us at anytime with additional questions or concerns.    Clint Guy, [place], [facility] Licensed, Certified Dispensing [email or web address] Phone: [phone]

## 2020-03-28 NOTE — Telephone Encounter (Signed)
Disclosed positive genetic test results. 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 It is important to note that this mutation is NOT associated with classic or attenuated Familial Adenomatous Polyposis, but does confer a moderately increased lifetime risk for colorectal cancer. We reviewed the cancer risks and recommendations for Jay Ward and his family members.   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 This VUS should not impact his medical management at this time.  We discussed the option to set up a follow-up genetic counseling appointment to review these results, although Jay Ward declined at this time. He knows he is welcome to call us at any point in the future with any questions he may have or to set up an appointment. We will send him a summary of our conversation and recommendations for him and his family members.

## 2020-03-29 ENCOUNTER — Encounter: Payer: Self-pay | Admitting: Family Medicine

## 2020-03-29 ENCOUNTER — Encounter: Payer: Self-pay | Admitting: Genetic Counselor

## 2020-04-12 DIAGNOSIS — L905 Scar conditions and fibrosis of skin: Secondary | ICD-10-CM | POA: Diagnosis not present

## 2020-04-12 DIAGNOSIS — Z85828 Personal history of other malignant neoplasm of skin: Secondary | ICD-10-CM | POA: Diagnosis not present

## 2020-04-12 DIAGNOSIS — D229 Melanocytic nevi, unspecified: Secondary | ICD-10-CM | POA: Diagnosis not present

## 2020-04-20 DIAGNOSIS — D128 Benign neoplasm of rectum: Secondary | ICD-10-CM | POA: Diagnosis not present

## 2020-05-30 DIAGNOSIS — D128 Benign neoplasm of rectum: Secondary | ICD-10-CM | POA: Diagnosis not present

## 2020-06-01 ENCOUNTER — Other Ambulatory Visit: Payer: Self-pay | Admitting: Surgery

## 2020-06-01 DIAGNOSIS — C20 Malignant neoplasm of rectum: Secondary | ICD-10-CM

## 2020-07-03 ENCOUNTER — Ambulatory Visit
Admission: RE | Admit: 2020-07-03 | Discharge: 2020-07-03 | Disposition: A | Discharge status: Home / Self Care | Payer: BC Managed Care – PPO | Source: Ambulatory Visit | Attending: Surgery | Admitting: Surgery

## 2020-07-03 ENCOUNTER — Other Ambulatory Visit: Payer: Self-pay

## 2020-07-03 DIAGNOSIS — C20 Malignant neoplasm of rectum: Secondary | ICD-10-CM | POA: Diagnosis not present

## 2020-07-16 DIAGNOSIS — D128 Benign neoplasm of rectum: Secondary | ICD-10-CM | POA: Diagnosis not present

## 2020-07-17 ENCOUNTER — Telehealth: Payer: Self-pay

## 2020-07-17 NOTE — Telephone Encounter (Signed)
Spoke with patient, he has been scheduled for a pre-visit with the nurse on Thursday, 08/30/20 at 11 AM, he is aware that he will need to check in on the 3rd floor. Patient is scheduled for a flex sig with Dr. Havery [name] in the [facility] on Wednesday, 09/12/20 at 3:30 PM, patient is aware that he will need to arrive at 2:30 PM with a care partner. Patient verbalized understanding of all information and had no concerns at the end of the call.

## 2020-07-17 NOTE — Telephone Encounter (Signed)
Armbruster, Jay Raspberry, MD  Yevette Edwards, RN [place] can you please contact this patient and book him for a flex sig with me at the [facility]?  Thanks.   Dr. Loni Muse

## 2020-07-19 ENCOUNTER — Other Ambulatory Visit: Payer: Self-pay | Admitting: Surgery

## 2020-07-19 DIAGNOSIS — C20 Malignant neoplasm of rectum: Secondary | ICD-10-CM

## 2020-08-30 ENCOUNTER — Ambulatory Visit: Payer: BC Managed Care – PPO

## 2020-08-30 ENCOUNTER — Other Ambulatory Visit: Payer: Self-pay

## 2020-08-30 VITALS — Ht 70.5 in | Wt 190.0 lb

## 2020-08-30 DIAGNOSIS — C20 Malignant neoplasm of rectum: Secondary | ICD-10-CM

## 2020-08-30 NOTE — Progress Notes (Signed)
No allergies to soy or egg Pt is not on blood thinners or diet pills Denies issues with sedation/intubation Denies atrial flutter/fib Denies constipation    Pt is aware of Covid safety and care partner requirements.   Is not to have 100% oxygen d/t to past chemo treatments

## 2020-09-11 ENCOUNTER — Encounter: Payer: Self-pay | Admitting: Gastroenterology

## 2020-09-12 ENCOUNTER — Encounter: Payer: Self-pay | Admitting: Gastroenterology

## 2020-09-12 ENCOUNTER — Ambulatory Visit (AMBULATORY_SURGERY_CENTER): Payer: BC Managed Care – PPO | Admitting: Gastroenterology

## 2020-09-12 ENCOUNTER — Other Ambulatory Visit: Payer: Self-pay

## 2020-09-12 VITALS — BP 124/76 | HR 43 | Temp 97.8°F | Resp 21 | Ht 70.5 in | Wt 190.0 lb

## 2020-09-12 DIAGNOSIS — D125 Benign neoplasm of sigmoid colon: Secondary | ICD-10-CM | POA: Diagnosis not present

## 2020-09-12 DIAGNOSIS — C20 Malignant neoplasm of rectum: Secondary | ICD-10-CM

## 2020-09-12 DIAGNOSIS — D124 Benign neoplasm of descending colon: Secondary | ICD-10-CM | POA: Diagnosis not present

## 2020-09-12 HISTORY — PX: FLEXIBLE SIGMOIDOSCOPY: SHX1649

## 2020-09-12 MED ORDER — SODIUM CHLORIDE 0.9 % IV SOLN: 500.0000 mL | Freq: Once | INTRAVENOUS | Status: DC

## 2020-09-12 NOTE — Progress Notes (Signed)
Pt's states no medical or surgical changes since previsit or office visit.   VS taken by CW 

## 2020-09-12 NOTE — Progress Notes (Signed)
Called to room to assist during endoscopic procedure.  Patient ID and intended procedure confirmed with present staff. Received instructions for my participation in the procedure from the performing physician.  

## 2020-09-12 NOTE — Progress Notes (Signed)
A/ox3, pleased with MAC, report to RN 

## 2020-09-12 NOTE — Op Note (Addendum)
[place] Patient Name: Jay Ward Procedure Date: 09/12/2020 4:02 PM MRN: [id number] Endoscopist: Remo Lipps P. Havery [name] , MD Age: 50 Referring MD:  Date of Birth: 10/29/1970 Gender: Male Account #: [id number] Procedure:                Flexible Sigmoidoscopy Indications:              High risk colon cancer surveillance: Personal                            history of rectal cancer (13 adenomas + distal                            rectal cancer 12/2019 - removed via trans-anal                            excision per surgery), post surgical surveillance Medicines:                Monitored Anesthesia Care Procedure:                Pre-Anesthesia Assessment:                           - Prior to the procedure, a History and Physical                            was performed, and patient medications and                            allergies were reviewed. The patient's tolerance of                            previous anesthesia was also reviewed. The risks                            and benefits of the procedure and the sedation                            options and risks were discussed with the patient.                            All questions were answered, and informed consent                            was obtained. Prior Anticoagulants: The patient has                            taken no previous anticoagulant or antiplatelet                            agents. ASA Grade Assessment: II - A patient with                            mild systemic disease. After reviewing the risks  and benefits, the patient was deemed in                            satisfactory condition to undergo the procedure.                           After obtaining informed consent, the scope was                            passed under direct vision. The Olympus PCF-[id number]                            (#[id number]) Colonoscope was introduced through the                            anus  and advanced to the the left transverse colon.                            The flexible sigmoidoscopy was accomplished without                            difficulty. The patient tolerated the procedure                            well. The quality of the bowel preparation was                            adequate. Scope In: Scope Out: Findings:                 The perianal and digital rectal examinations were                            normal.                           Two sessile polyps were found in the proximal                            descending colon and distal transverse colon. The                            polyps were 3 to 4 mm in size. These polyps were                            removed with a cold snare. Resection and retrieval                            were complete.                           A 3 to 4 mm polyp was found in the sigmoid colon.                            The polyp was  sessile. The polyp was removed with a                            cold snare. Resection and retrieval were complete.                           A large scar was found in the distal rectum. The                            scar tissue was healthy in appearance. There was no                            evidence of the previous polyp / malignancy.                           A few small-mouthed diverticula were found in the                            left colon.                           The exam was otherwise without abnormality.                            Residual stool noted in distal transverse and                            descending colon. Complications:            No immediate complications. Estimated blood loss:                            Minimal. Estimated Blood Loss:     Estimated blood loss was minimal. Impression:               - Two 3 to 4 mm polyps in the proximal descending                            colon and in the distal transverse colon, removed                            with a cold  snare. Resected and retrieved.                           - One 3 to 4 mm polyp in the sigmoid colon, removed                            with a cold snare. Resected and retrieved.                           - Well healed scar in the distal rectum without any                            recurrent polypoid tissue.                           -  Diverticulosis in the left colon.                           - The examination was otherwise normal. Recommendation:           - Discharge patient to home.                           - Resume previous diet.                           - Continue present medications.                           - Await pathology results                           - Full colonoscopy in 6 months given burden of                            polyps removed 12/2019 Remo Lipps P. Hannan Hutmacher, MD 09/12/2020 4:34:07 PM This report has been signed electronically.

## 2020-09-12 NOTE — Patient Instructions (Signed)
Information on polyps given to you today.  Await pathology results.  Resume previous diet and medications.  Full colonoscopy in 6 months.  YOU HAD AN ENDOSCOPIC PROCEDURE TODAY AT [facility]:   Refer to the procedure report that was given to you for any specific questions about what was found during the examination.  If the procedure report does not answer your questions, please call your gastroenterologist to clarify.  If you requested that your care partner not be given the details of your procedure findings, then the procedure report has been included in a sealed envelope for you to review at your convenience later.  YOU SHOULD EXPECT: Some feelings of bloating in the abdomen. Passage of more gas than usual.  Walking can help get rid of the air that was put into your GI tract during the procedure and reduce the bloating. If you had a lower endoscopy (such as a colonoscopy or flexible sigmoidoscopy) you may notice spotting of blood in your stool or on the toilet paper. If you underwent a bowel prep for your procedure, you may not have a normal bowel movement for a few days.  Please Note:  You might notice some irritation and congestion in your nose or some drainage.  This is from the oxygen used during your procedure.  There is no need for concern and it should clear up in a day or so.  SYMPTOMS TO REPORT IMMEDIATELY:  Following lower endoscopy (colonoscopy or flexible sigmoidoscopy):  Excessive amounts of blood in the stool  Significant tenderness or worsening of abdominal pains  Swelling of the abdomen that is new, acute  Fever of 100F or higher   For urgent or emergent issues, a gastroenterologist can be reached at any hour by calling [phone]. Do not use MyChart messaging for urgent concerns.    DIET:  We do recommend a small meal at first, but then you may proceed to your regular diet.  Drink plenty of fluids but you should avoid alcoholic beverages for 24  hours.  ACTIVITY:  You should plan to take it easy for the rest of today and you should NOT DRIVE or use heavy machinery until tomorrow (because of the sedation medicines used during the test).    FOLLOW UP: Our staff will call the number listed on your records 48-72 hours following your procedure to check on you and address any questions or concerns that you may have regarding the information given to you following your procedure. If we do not reach you, we will leave a message.  We will attempt to reach you two times.  During this call, we will ask if you have developed any symptoms of COVID 19. If you develop any symptoms (ie: fever, flu-like symptoms, shortness of breath, cough etc.) before then, please call [phone].  If you test positive for Covid 19 in the 2 weeks post procedure, please call and report this information to [place].    If any biopsies were taken you will be contacted by phone or by letter within the next 1-3 weeks.  Please call us at [phone] if you have not heard about the biopsies in 3 weeks.    SIGNATURES/CONFIDENTIALITY: You and/or your care partner have signed paperwork which will be entered into your electronic medical record.  These signatures attest to the fact that that the information above on your After Visit Summary has been reviewed and is understood.  Full responsibility of the confidentiality of this discharge information lies  with you and/or your care-partner.  

## 2020-09-13 ENCOUNTER — Other Ambulatory Visit: Payer: BC Managed Care – PPO

## 2020-09-13 ENCOUNTER — Ambulatory Visit
Admission: RE | Admit: 2020-09-13 | Discharge: 2020-09-13 | Disposition: A | Discharge status: Home / Self Care | Payer: BC Managed Care – PPO | Source: Ambulatory Visit | Attending: Surgery | Admitting: Surgery

## 2020-09-13 DIAGNOSIS — C20 Malignant neoplasm of rectum: Secondary | ICD-10-CM

## 2020-09-14 ENCOUNTER — Other Ambulatory Visit: Payer: Self-pay | Admitting: Surgery

## 2020-09-14 ENCOUNTER — Ambulatory Visit
Admission: RE | Admit: 2020-09-14 | Discharge: 2020-09-14 | Disposition: A | Discharge status: Home / Self Care | Payer: BC Managed Care – PPO | Source: Ambulatory Visit | Attending: Surgery | Admitting: Surgery

## 2020-09-14 ENCOUNTER — Telehealth: Payer: Self-pay

## 2020-09-14 DIAGNOSIS — C20 Malignant neoplasm of rectum: Secondary | ICD-10-CM

## 2020-09-14 DIAGNOSIS — K7689 Other specified diseases of liver: Secondary | ICD-10-CM | POA: Diagnosis not present

## 2020-09-14 DIAGNOSIS — R918 Other nonspecific abnormal finding of lung field: Secondary | ICD-10-CM | POA: Diagnosis not present

## 2020-09-14 MED ORDER — IOPAMIDOL (ISOVUE-300) INJECTION 61%
100.0000 mL | Freq: Once | INTRAVENOUS | Status: AC | PRN
  Administered 2020-09-14: 100 mL via INTRAVENOUS

## 2020-09-14 NOTE — Telephone Encounter (Signed)
  Follow up Call-  Call back number 09/12/2020 12/30/2019  Post procedure Call Back phone  # [phone]  Permission to leave phone message Yes Yes  Some recent data might be hidden     Patient questions:  Do you have a fever, pain , or abdominal swelling? No. Pain Score  0 *  Have you tolerated food without any problems? Yes.    Have you been able to return to your normal activities? Yes.    Do you have any questions about your discharge instructions: Diet   No. Medications  No. Follow up visit  No.  Do you have questions or concerns about your Care? No.  Actions: * If pain score is 4 or above: No action needed, pain <4.

## 2020-10-08 DIAGNOSIS — C20 Malignant neoplasm of rectum: Secondary | ICD-10-CM | POA: Diagnosis not present

## 2020-10-26 ENCOUNTER — Other Ambulatory Visit: Payer: Self-pay

## 2020-10-26 ENCOUNTER — Ambulatory Visit (INDEPENDENT_AMBULATORY_CARE_PROVIDER_SITE_OTHER): Payer: BC Managed Care – PPO | Admitting: Family Medicine

## 2020-10-26 ENCOUNTER — Encounter: Payer: Self-pay | Admitting: Family Medicine

## 2020-10-26 VITALS — BP 118/71 | HR 45 | Temp 97.8°F | Resp 16 | Ht 70.5 in | Wt 190.8 lb

## 2020-10-26 DIAGNOSIS — Z8042 Family history of malignant neoplasm of prostate: Secondary | ICD-10-CM | POA: Diagnosis not present

## 2020-10-26 DIAGNOSIS — Z125 Encounter for screening for malignant neoplasm of prostate: Secondary | ICD-10-CM

## 2020-10-26 DIAGNOSIS — R0789 Other chest pain: Secondary | ICD-10-CM | POA: Diagnosis not present

## 2020-10-26 DIAGNOSIS — Z0000 Encounter for general adult medical examination without abnormal findings: Secondary | ICD-10-CM | POA: Diagnosis not present

## 2020-10-26 LAB — TSH: TSH: 1.04 u[IU]/mL (ref 0.35–5.50)

## 2020-10-26 LAB — CBC WITH DIFFERENTIAL/PLATELET
Basophils Absolute: 0 10*3/uL (ref 0.0–0.1)
Basophils Relative: 1 % (ref 0.0–3.0)
Eosinophils Absolute: 0.1 10*3/uL (ref 0.0–0.7)
Eosinophils Relative: 2.2 % (ref 0.0–5.0)
HCT: 41.2 % (ref 39.0–52.0)
Hemoglobin: 13.7 g/dL (ref 13.0–17.0)
Lymphocytes Relative: 38.4 % (ref 12.0–46.0)
Lymphs Abs: 1.7 10*3/uL (ref 0.7–4.0)
MCHC: 33.2 g/dL (ref 30.0–36.0)
MCV: 87.3 fl (ref 78.0–100.0)
Monocytes Absolute: 0.4 10*3/uL (ref 0.1–1.0)
Monocytes Relative: 8.2 % (ref 3.0–12.0)
Neutro Abs: 2.3 10*3/uL (ref 1.4–7.7)
Neutrophils Relative %: 50.2 % (ref 43.0–77.0)
Platelets: 228 10*3/uL (ref 150.0–400.0)
RBC: 4.73 Mil/uL (ref 4.22–5.81)
RDW: 14.6 % (ref 11.5–15.5)
WBC: 4.5 10*3/uL (ref 4.0–10.5)

## 2020-10-26 LAB — COMPREHENSIVE METABOLIC PANEL
ALT: 16 U/L (ref 0–53)
AST: 21 U/L (ref 0–37)
Albumin: 4.4 g/dL (ref 3.5–5.2)
Alkaline Phosphatase: 59 U/L (ref 39–117)
BUN: 16 mg/dL (ref 6–23)
CO2: 24 mEq/L (ref 19–32)
Calcium: 9.3 mg/dL (ref 8.4–10.5)
Chloride: 106 mEq/L (ref 96–112)
Creatinine, Ser: 0.92 mg/dL (ref 0.40–1.50)
GFR: 98.74 mL/min (ref >60.00)
Glucose, Bld: 90 mg/dL (ref 70–99)
Potassium: 4.4 mEq/L (ref 3.5–5.1)
Sodium: 141 mEq/L (ref 135–145)
Total Bilirubin: 0.5 mg/dL (ref 0.2–1.2)
Total Protein: 6.7 g/dL (ref 6.0–8.3)

## 2020-10-26 LAB — LIPID PANEL
Cholesterol: 201 mg/dL — ABNORMAL HIGH (ref 0–200)
HDL: 70.3 mg/dL (ref >39.00)
LDL Cholesterol: 120 mg/dL — ABNORMAL HIGH (ref 0–99)
NonHDL: 130.38
Total CHOL/HDL Ratio: 3
Triglycerides: 54 mg/dL (ref 0.0–149.0)
VLDL: 10.8 mg/dL (ref 0.0–40.0)

## 2020-10-26 LAB — PSA: PSA: 1.01 ng/mL (ref 0.10–4.00)

## 2020-10-26 NOTE — Progress Notes (Addendum)
Office Note 10/26/2020  CC:  Chief Complaint  Patient presents with   Annual Exam   HPI:  Jay Ward is a 50 y.o. White male who is here for annual health maintenance exam. A/P as of last visit: "Health maintenance exam: Reviewed age and gender appropriate health maintenance issues (prudent diet, regular exercise, health risks of tobacco and excessive alcohol, use of seatbelts, fire alarms in home, use of sunscreen).  Also reviewed age and gender appropriate health screening as well as vaccine recommendations. Vaccines: Tdap UTD.  Covid 19->UTD. Labs:  Fasting HP labs + PSA ordered. Prostate ca screening: + FH of prost ca in father--> DRE normal, PSA. Colon ca screening: per latest guidelines he is due to start colon ca screening->referred today.   Reassured pt regarding his change in bowel habits the last month or so, nothing alarming: watchful waiting approach."  INTERIM HX: Feeling well.  Average 70-80 miles running per month. Working from home indefinitely now--[employer].  Pulled chest wall muscle when loading bikes on top of car--lasted a few days.  Occ feeling of some feeling in chest that is not pain or chest tightness, sounds like short lived.  Last time he felt it was a couple months ago. No SOB, exertional CP, dizziness, or palpitations.    2 cups coffee per day. Past Medical History:  Diagnosis Date   Allergy    seasonal   Basal cell carcinoma 10/12/2019   Cancer of multiple primary sites [facility])    genetics referral by gen surg [id number]   Complication of anesthesia    Can not have 100% oxygen due to previous chemo tx   Family history of basal cell carcinoma    Family history of colon cancer    Family history of kidney cancer    Family history of prostate cancer in father    Hay fever    History of colon polyps    multiple   Inguinal hernia    Right   Melanoma ([place]) 2014   on his back.  Also R thigh with dysplastic nevus with moderate atypia  02/2016.  He sees Derm (Dr. Allyson Sabal) q 6 mo: as of 08/2017 derm f/u [facility].   Rectal adenocarcinoma ([place]) 01/2020   surgery 02/09/20, no adjuvant therapy.   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    Past Surgical History:  Procedure Laterality Date   APPENDECTOMY  1990   COLONOSCOPY  12/2019   FLEXIBLE SIGMOIDOSCOPY  09/12/2020   3 polyps; recall 02/2021 for full colonoscopy d/t high burden of polyps on colonoscopy 12/2019.   INGUINAL HERNIA REPAIR Right    MELANOMA EXCISION     with lymph nodes   MOHS SURGERY     ORCHIECTOMY Left 2003   RECTAL EXAM UNDER ANESTHESIA N/A 02/09/2020   Procedure: RECTAL EXAM UNDER ANESTHESIA;  Surgeon: Ileana Roup, MD;  [facility]: [facility];  Service: General;  Laterality: N/A;   TRANSANAL EXCISION OF RECTAL MASS N/A 02/09/2020   Procedure: anorectal exam under anesthesia  excision left anorectal polpoid lesion;  Surgeon: Ileana Roup, MD;  Location: [facility];  Service: General;  Laterality: N/A;    Family History  Problem Relation Age of Onset   Basal cell carcinoma Mother        several   Prostate cancer Father        dx 41s   Hyperlipidemia Father  Hypertension Father    Colon polyps Father 46       unknown number   Kidney cancer Father        dx 42s   Colon cancer Paternal Aunt        dx in early 47's   Heart disease Maternal Grandfather    Stroke Maternal Grandfather    Colon cancer Paternal Grandmother        Dx in her 31's   Colon polyps Brother        <10   Alzheimer's disease Paternal Grandfather    Esophageal cancer Neg Hx    Rectal cancer Neg Hx    Stomach cancer Neg Hx     Social History   Socioeconomic History   Marital status: Married    Spouse name: Not on file   Number of children: Not on file   Years of education: Not on file   Highest education level: Not on file  Occupational History   Not on file  Tobacco  Use   Smoking status: Former    Packs/day: 0.25    Years: 10.00    Pack years: 2.50    Types: Cigarettes    Quit date: 03/31/2001    Years since quitting: 19.5   Smokeless tobacco: Never  Vaping Use   Vaping Use: Never used  Substance and Sexual Activity   Alcohol use: Yes    Alcohol/week: 6.0 standard drinks    Types: 6 Cans of beer per week   Drug use: No   Sexual activity: Not on file  Other Topics Concern   Not on file  Social History Narrative   Married, no children.   Orig from [place].   Educ: BS   Occup: Banking   Minimal tob use in the past--quit 2003.   Alc: rare.    Social Determinants of Health   Financial Resource Strain: Not on file  Food Insecurity: Not on file  Transportation Needs: Not on file  Physical Activity: Not on file  Stress: Not on file  Social Connections: Not on file  Intimate Partner Violence: Not on file    Outpatient Medications Prior to Visit  Medication Sig Dispense Refill   psyllium (METAMUCIL) 58.6 % packet Take 1 packet by mouth daily.     Ascorbic Acid (VITAMIN C PO) Take by mouth daily.  (Patient not taking: No sig reported)     0.9 %  sodium chloride infusion      No facility-administered medications prior to visit.    No Known Allergies  ROS Review of Systems  Constitutional:  Negative for appetite change, chills, fatigue and fever.  HENT:  Negative for congestion, dental problem, ear pain and sore throat.   Eyes:  Negative for discharge, redness and visual disturbance.  Respiratory:  Negative for cough, chest tightness, shortness of breath and wheezing.   Cardiovascular:  Negative for chest pain, palpitations and leg swelling.  Gastrointestinal:  Negative for abdominal pain, blood in stool, diarrhea, nausea and vomiting.  Genitourinary:  Negative for difficulty urinating, dysuria, flank pain, frequency, hematuria and urgency.  Musculoskeletal:  Negative for arthralgias, back pain, joint swelling, myalgias and neck  stiffness.  Skin:  Negative for pallor and rash.  Neurological:  Negative for dizziness, speech difficulty, weakness and headaches.  Hematological:  Negative for adenopathy. Does not bruise/bleed easily.  Psychiatric/Behavioral:  Negative for confusion and sleep disturbance. The patient is not nervous/anxious.    PE; Vitals with BMI 10/26/2020 09/12/2020 09/12/2020  Height  5' 10.5" - -  Weight 190 lbs 13 oz - -  BMI [id number] - -  Systolic [id number]  Diastolic 71 76 80  Pulse 45 43 44   Gen: Alert, well appearing.  Patient is oriented to person, place, time, and situation. AFFECT: pleasant, lucid thought and speech. ENT: Ears: EACs clear, normal epithelium.  TMs with good light reflex and landmarks bilaterally.  Eyes: no injection, icteris, swelling, or exudate.  EOMI, PERRLA. Nose: no drainage or turbinate edema/swelling.  No injection or focal lesion.  Mouth: lips without lesion/swelling.  Oral mucosa pink and moist.  Dentition intact and without obvious caries or gingival swelling.  Oropharynx without erythema, exudate, or swelling.  Neck: supple/nontender.  No LAD, mass, or TM.  Carotid pulses 2+ bilaterally, without bruits. CV: RRR, no m/r/g.   LUNGS: CTA bilat, nonlabored resps, good aeration in all lung fields. ABD: soft, NT, ND, BS normal.  No hepatospenomegaly or mass.  No bruits. EXT: no clubbing, cyanosis, or edema.  Musculoskeletal: no joint swelling, erythema, warmth, or tenderness.  ROM of all joints intact. Skin - no sores or suspicious lesions or rashes or color changes  Pertinent labs:  Lab Results  Component Value Date   TSH 1.66 10/26/2019   Lab Results  Component Value Date   WBC 4.9 01/27/2020   HGB 13.9 01/27/2020   HCT 41.2 01/27/2020   MCV 87.5 01/27/2020   PLT 244 01/27/2020   Lab Results  Component Value Date   CREATININE 0.83 10/26/2019   BUN 12 10/26/2019   NA 141 10/26/2019   K 3.8 10/26/2019   CL 107 10/26/2019   CO2 29 10/26/2019   Lab  Results  Component Value Date   ALT 19 10/26/2019   AST 22 10/26/2019   ALKPHOS 59 10/26/2019   BILITOT 0.9 10/26/2019   Lab Results  Component Value Date   CHOL 209 (H) 10/26/2019   Lab Results  Component Value Date   HDL 75.70 10/26/2019   Lab Results  Component Value Date   LDLCALC 119 (H) 10/26/2019   Lab Results  Component Value Date   TRIG 76.0 10/26/2019   Lab Results  Component Value Date   CHOLHDL 3 10/26/2019   Lab Results  Component Value Date   PSA 0.98 10/26/2019   PSA 0.91 10/19/2018   PSA 1.15 10/14/2017   ASSESSMENT AND PLAN:   1) Vague chest discomfort, onset after chest wall strain. No red flag features for dysrhythmia, ischemia, HF, or other ominous cv etiology. No suspicion of PE or RAD. He is in excellent shape, avid runner. He'll monitor for any change in sx's or inc in frequency. Reassurance given today.  2) Health maintenance exam: Reviewed age and gender appropriate health maintenance issues (prudent diet, regular exercise, health risks of tobacco and excessive alcohol, use of seatbelts, fire alarms in home, use of sunscreen).  Also reviewed age and gender appropriate health screening as well as vaccine recommendations. Vaccines: ALL UTD. Labs: fasting HP + PSA. Prostate ca screening: PSA today. Colon ca screening: pt followed closely by GI and general surgery for his recent hx of rectal adenocarcinoma. Skin ca screening: he gets quite frequent derm eval's d/t hx of melanoma and BCC.  An After Visit Summary was printed and given to the patient.  FOLLOW UP:  Return in about 1 year (around 10/26/2021) for annual CPE (fasting).  Signed:  Crissie Sickles, MD           10/26/2020

## 2020-10-26 NOTE — Patient Instructions (Signed)
Health Maintenance, Male Adopting a healthy lifestyle and getting preventive care are important in promoting health and wellness. Ask your health care provider about: The right schedule for you to have regular tests and exams. Things you can do on your own to prevent diseases and keep yourself healthy. What should I know about diet, weight, and exercise? Eat a healthy diet  Eat a diet that includes plenty of vegetables, fruits, low-fat dairy products, and lean protein. Do not eat a lot of foods that are high in solid fats, added sugars, or sodium.  Maintain a healthy weight Body mass index (BMI) is a measurement that can be used to identify possible weight problems. It estimates body fat based on height and weight. Your health care provider can help determine your BMI and help you achieve or maintain ahealthy weight. Get regular exercise Get regular exercise. This is one of the most important things you can do for your health. Most adults should: Exercise for at least 150 minutes each week. The exercise should increase your heart rate and make you sweat (moderate-intensity exercise). Do strengthening exercises at least twice a week. This is in addition to the moderate-intensity exercise. Spend less time sitting. Even light physical activity can be beneficial. Watch cholesterol and blood lipids Have your blood tested for lipids and cholesterol at 48 years of age, then havethis test every 5 years. You may need to have your cholesterol levels checked more often if: Your lipid or cholesterol levels are high. You are older than 48 years of age. You are at high risk for heart disease. What should I know about cancer screening? Many types of cancers can be detected early and may often be prevented. Depending on your health history and family history, you may need to have cancer screening at various ages. This may include screening for: Colorectal cancer. Prostate cancer. Skin cancer. Lung  cancer. What should I know about heart disease, diabetes, and high blood pressure? Blood pressure and heart disease High blood pressure causes heart disease and increases the risk of stroke. This is more likely to develop in people who have high blood pressure readings, are of African descent, or are overweight. Talk with your health care provider about your target blood pressure readings. Have your blood pressure checked: Every 3-5 years if you are 18-39 years of age. Every year if you are 40 years old or older. If you are between the ages of 65 and 75 and are a current or former smoker, ask your health care provider if you should have a one-time screening for abdominal aortic aneurysm (AAA). Diabetes Have regular diabetes screenings. This checks your fasting blood sugar level. Have the screening done: Once every three years after age 45 if you are at a normal weight and have a low risk for diabetes. More often and at a younger age if you are overweight or have a high risk for diabetes. What should I know about preventing infection? Hepatitis B If you have a higher risk for hepatitis B, you should be screened for this virus. Talk with your health care provider to find out if you are at risk forhepatitis B infection. Hepatitis C Blood testing is recommended for: Everyone born from 1945 through 1965. Anyone with known risk factors for hepatitis C. Sexually transmitted infections (STIs) You should be screened each year for STIs, including gonorrhea and chlamydia, if: You are sexually active and are younger than 48 years of age. You are older than 48 years of age   and your health care provider tells you that you are at risk for this type of infection. Your sexual activity has changed since you were last screened, and you are at increased risk for chlamydia or gonorrhea. Ask your health care provider if you are at risk. Ask your health care provider about whether you are at high risk for HIV.  Your health care provider may recommend a prescription medicine to help prevent HIV infection. If you choose to take medicine to prevent HIV, you should first get tested for HIV. You should then be tested every 3 months for as long as you are taking the medicine. Follow these instructions at home: Lifestyle Do not use any products that contain nicotine or tobacco, such as cigarettes, e-cigarettes, and chewing tobacco. If you need help quitting, ask your health care provider. Do not use street drugs. Do not share needles. Ask your health care provider for help if you need support or information about quitting drugs. Alcohol use Do not drink alcohol if your health care provider tells you not to drink. If you drink alcohol: Limit how much you have to 0-2 drinks a day. Be aware of how much alcohol is in your drink. In the [place] one drink equals one 12 oz bottle of beer (355 mL), one 5 oz glass of wine (148 mL), or one 1 oz glass of hard liquor (44 mL). General instructions Schedule regular health, dental, and eye exams. Stay current with your vaccines. Tell your health care provider if: You often feel depressed. You have ever been abused or do not feel safe at home. Summary Adopting a healthy lifestyle and getting preventive care are important in promoting health and wellness. Follow your health care provider's instructions about healthy diet, exercising, and getting tested or screened for diseases. Follow your health care provider's instructions on monitoring your cholesterol and blood pressure. This information is not intended to replace advice given to you by your health care provider. Make sure you discuss any questions you have with your healthcare provider. Document Revised: 03/10/2018 Document Reviewed: 03/10/2018 Elsevier Patient Education  2022 Elsevier Inc.  

## 2020-11-06 DIAGNOSIS — D229 Melanocytic nevi, unspecified: Secondary | ICD-10-CM | POA: Diagnosis not present

## 2020-11-06 DIAGNOSIS — Z872 Personal history of diseases of the skin and subcutaneous tissue: Secondary | ICD-10-CM | POA: Diagnosis not present

## 2020-11-06 DIAGNOSIS — Z85828 Personal history of other malignant neoplasm of skin: Secondary | ICD-10-CM | POA: Diagnosis not present

## 2020-11-06 DIAGNOSIS — L905 Scar conditions and fibrosis of skin: Secondary | ICD-10-CM | POA: Diagnosis not present

## 2021-02-13 ENCOUNTER — Other Ambulatory Visit: Payer: Self-pay | Admitting: Surgery

## 2021-02-13 DIAGNOSIS — C20 Malignant neoplasm of rectum: Secondary | ICD-10-CM

## 2021-03-11 ENCOUNTER — Ambulatory Visit
Admission: RE | Admit: 2021-03-11 | Discharge: 2021-03-11 | Disposition: A | Discharge status: Home / Self Care | Payer: BC Managed Care – PPO | Source: Ambulatory Visit | Attending: Surgery | Admitting: Surgery

## 2021-03-11 ENCOUNTER — Other Ambulatory Visit: Payer: Self-pay

## 2021-03-11 DIAGNOSIS — C61 Malignant neoplasm of prostate: Secondary | ICD-10-CM | POA: Diagnosis not present

## 2021-03-11 DIAGNOSIS — C20 Malignant neoplasm of rectum: Secondary | ICD-10-CM

## 2021-03-11 DIAGNOSIS — R590 Localized enlarged lymph nodes: Secondary | ICD-10-CM | POA: Diagnosis not present

## 2021-03-11 DIAGNOSIS — Z85820 Personal history of malignant melanoma of skin: Secondary | ICD-10-CM | POA: Diagnosis not present

## 2021-03-26 ENCOUNTER — Encounter: Payer: Self-pay | Admitting: Gastroenterology

## 2021-04-11 ENCOUNTER — Other Ambulatory Visit: Payer: Self-pay

## 2021-04-11 ENCOUNTER — Ambulatory Visit
Admission: RE | Admit: 2021-04-11 | Discharge: 2021-04-11 | Disposition: A | Discharge status: Home / Self Care | Payer: BC Managed Care – PPO | Source: Ambulatory Visit | Attending: Surgery | Admitting: Surgery

## 2021-04-11 DIAGNOSIS — Z85048 Personal history of other malignant neoplasm of rectum, rectosigmoid junction, and anus: Secondary | ICD-10-CM | POA: Diagnosis not present

## 2021-04-11 DIAGNOSIS — K6289 Other specified diseases of anus and rectum: Secondary | ICD-10-CM | POA: Diagnosis not present

## 2021-04-11 DIAGNOSIS — K769 Liver disease, unspecified: Secondary | ICD-10-CM | POA: Diagnosis not present

## 2021-04-11 DIAGNOSIS — K5730 Diverticulosis of large intestine without perforation or abscess without bleeding: Secondary | ICD-10-CM | POA: Diagnosis not present

## 2021-04-11 MED ORDER — IOPAMIDOL (ISOVUE-300) INJECTION 61%
100.0000 mL | Freq: Once | INTRAVENOUS | Status: AC | PRN
  Administered 2021-04-11: 100 mL via INTRAVENOUS

## 2021-04-17 DIAGNOSIS — Z08 Encounter for follow-up examination after completed treatment for malignant neoplasm: Secondary | ICD-10-CM | POA: Diagnosis not present

## 2021-04-17 DIAGNOSIS — C20 Malignant neoplasm of rectum: Secondary | ICD-10-CM | POA: Diagnosis not present

## 2021-04-17 DIAGNOSIS — Z85048 Personal history of other malignant neoplasm of rectum, rectosigmoid junction, and anus: Secondary | ICD-10-CM | POA: Diagnosis not present

## 2021-04-19 ENCOUNTER — Telehealth: Payer: Self-pay

## 2021-04-19 NOTE — Telephone Encounter (Signed)
Called and spoke with patient. He has been scheduled for a phone pre-visit appt on Thursday, 05/23/21 at 2:30 pm. Patient is scheduled for a colonoscopy in the [place] with Dr. Havery [name] on Thursday, 06/06/21 at 9 am. Pt verbalized understanding and had no concerns at the end of the call.

## 2021-04-19 NOTE — Telephone Encounter (Signed)
-----   Message from Yetta Flock, MD sent at 04/18/2021  4:48 PM EST ----- Regarding: RE: Follow-up Thanks [name]. I can do 1/30 at 730 if he wants to be added in that spot. Alternative I think another one of my patient's cancelled next week if there are any openings then? I can't see the open spot though. If he can't do 1/30 let me know and will try to add him in somewhere in February. Thanks!  ----- Message ----- From: Yevette Edwards, RN Sent: 04/18/2021   2:21 PM EST To: Yetta Flock, MD Subject: RE: Follow-up                                  Dr. Havery [name], it looks like your [facility] schedule is currently booked for February. Patient will need a pre-visit appt and their next available is 2/6. Procedure will need to be scheduled at least a week after that for insurance purposes.   Please let me know how you wish to proceed.  Thanks, [employer] ----- Message ----- From: Yetta Flock, MD Sent: 04/17/2021   4:18 PM EST To: Yevette Edwards, RN Subject: FW: Follow-up                                  Jay Ward, looks like this patient has had a hard time getting through to [place]? Can you help me out and contact this patient to book a direct colonoscopy at the [facility]. I am pretty sure I should have plenty of openings in the next month or so. If not, let me know and I will add him in somewhere to accommodate him. Thank you!  Dr. 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 He had some issues scheduling his colonoscopy. Michela Pitcher he had gotten 3 letters and has called multiple times but had issues with February being full and  was somewhat frustrated with office staff... he loves you! I told him I'd at least reach out and see if there was anything we could do to help.   Gerald Stabs

## 2021-05-09 DIAGNOSIS — L814 Other melanin hyperpigmentation: Secondary | ICD-10-CM | POA: Diagnosis not present

## 2021-05-09 DIAGNOSIS — L538 Other specified erythematous conditions: Secondary | ICD-10-CM | POA: Diagnosis not present

## 2021-05-09 DIAGNOSIS — L820 Inflamed seborrheic keratosis: Secondary | ICD-10-CM | POA: Diagnosis not present

## 2021-05-09 DIAGNOSIS — D492 Neoplasm of unspecified behavior of bone, soft tissue, and skin: Secondary | ICD-10-CM | POA: Diagnosis not present

## 2021-05-09 DIAGNOSIS — D225 Melanocytic nevi of trunk: Secondary | ICD-10-CM | POA: Diagnosis not present

## 2021-05-09 DIAGNOSIS — L821 Other seborrheic keratosis: Secondary | ICD-10-CM | POA: Diagnosis not present

## 2021-05-15 ENCOUNTER — Other Ambulatory Visit: Payer: Self-pay

## 2021-05-15 ENCOUNTER — Ambulatory Visit (AMBULATORY_SURGERY_CENTER): Payer: BC Managed Care – PPO | Admitting: *Deleted

## 2021-05-15 VITALS — Ht 71.0 in | Wt 185.0 lb

## 2021-05-15 DIAGNOSIS — C20 Malignant neoplasm of rectum: Secondary | ICD-10-CM

## 2021-05-15 MED ORDER — NA SULFATE-K SULFATE-MG SULF 17.5-3.13-1.6 GM/177ML PO SOLN: 1.0000 | Freq: Once | ORAL | 0 refills | Status: AC

## 2021-05-15 NOTE — Progress Notes (Signed)

## 2021-05-30 ENCOUNTER — Encounter: Payer: Self-pay | Admitting: Gastroenterology

## 2021-06-06 ENCOUNTER — Ambulatory Visit (AMBULATORY_SURGERY_CENTER): Payer: BC Managed Care – PPO | Admitting: Gastroenterology

## 2021-06-06 ENCOUNTER — Other Ambulatory Visit: Payer: Self-pay

## 2021-06-06 ENCOUNTER — Encounter: Payer: Self-pay | Admitting: Gastroenterology

## 2021-06-06 VITALS — BP 104/69 | HR 40 | Temp 97.8°F | Resp 11 | Ht 70.5 in | Wt 185.0 lb

## 2021-06-06 DIAGNOSIS — C20 Malignant neoplasm of rectum: Secondary | ICD-10-CM

## 2021-06-06 DIAGNOSIS — D128 Benign neoplasm of rectum: Secondary | ICD-10-CM | POA: Diagnosis not present

## 2021-06-06 DIAGNOSIS — D123 Benign neoplasm of transverse colon: Secondary | ICD-10-CM | POA: Diagnosis not present

## 2021-06-06 DIAGNOSIS — Z85048 Personal history of other malignant neoplasm of rectum, rectosigmoid junction, and anus: Secondary | ICD-10-CM

## 2021-06-06 DIAGNOSIS — D120 Benign neoplasm of cecum: Secondary | ICD-10-CM

## 2021-06-06 DIAGNOSIS — D122 Benign neoplasm of ascending colon: Secondary | ICD-10-CM | POA: Diagnosis not present

## 2021-06-06 DIAGNOSIS — D129 Benign neoplasm of anus and anal canal: Secondary | ICD-10-CM

## 2021-06-06 DIAGNOSIS — Z86010 Personal history of colonic polyps: Secondary | ICD-10-CM

## 2021-06-06 MED ORDER — SODIUM CHLORIDE 0.9 % IV SOLN: 500.0000 mL | Freq: Once | INTRAVENOUS | Status: DC

## 2021-06-06 NOTE — Progress Notes (Signed)
VS  DT ? ?Pt's states no medical or surgical changes since previsit or office visit. ? ?

## 2021-06-06 NOTE — Progress Notes (Signed)
[facility] History and Physical ? ? ?Primary Care Physician:  Tammi Sou, MD ? ? ?Reason for Procedure:   History of rectal cancer / colon polyps ? ?Plan:    colonoscopy ? ? ? ? ?HPI: Jay Ward is a 51 y.o. male  here for colonoscopy surveillance - history of colon polyps (13) in 2021 along with rectal cancer. APC gene positive. Here for surveillance colonoscopy. Patient denies any bowel symptoms at this time. Otherwise feels well without any cardiopulmonary symptoms.  ? ? ?Past Medical History:  ?Diagnosis Date  ? Allergy   ? seasonal  ? Basal cell carcinoma 10/12/2019  ? Cancer of multiple primary sites [facility])   ? genetics referral by gen surg 01/17/20  ? Complication of anesthesia   ? Can not have 100% oxygen due to previous chemo tx  ? Family history of basal cell carcinoma   ? Family history of colon cancer   ? Family history of kidney cancer   ? Family history of prostate cancer in father   ? Hay fever   ? History of colon polyps   ? multiple  ? Inguinal hernia   ? Right  ? Melanoma ([place]) 2014  ? on his back.  Also R thigh with dysplastic nevus with moderate atypia 02/2016.  He sees Derm (Dr. Allyson Sabal) q 6 mo: as of 08/2017 derm f/u [facility].  ? Rectal adenocarcinoma ([place]) 01/2020  ? surgery 02/09/20, no adjuvant therapy.  ? 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 orchiectomy + chemo--he has been released from f/u by urology due to long term remission status.  ? ? ?Past Surgical History:  ?Procedure Laterality Date  ? APPENDECTOMY  1990  ? COLONOSCOPY  12/2019  ? FLEXIBLE SIGMOIDOSCOPY  09/12/2020  ? 3 polyps; recall 02/2021 for full colonoscopy d/t high burden of polyps on colonoscopy 12/2019.  ? INGUINAL HERNIA REPAIR Right   ? MELANOMA EXCISION    ? with lymph nodes  ? MOHS SURGERY    ? ORCHIECTOMY Left 2003  ? RECTAL EXAM UNDER ANESTHESIA N/A 02/09/2020  ? Procedure: RECTAL EXAM UNDER ANESTHESIA;  Surgeon: Ileana Roup, MD;  Location[facility];  Service: General;  Laterality: N/A;  ?  SURGERY SCROTAL / TESTICULAR    ? had hernia repair at the same  ? TRANSANAL EXCISION OF RECTAL MASS N/A 02/09/2020  ? Procedure: anorectal exam under anesthesia  excision left anorectal polpoid lesion;  Surgeon: Ileana Roup, MD;  [facility]: [facility];  Service: General;  Laterality: N/A;  ? ? ?Prior to Admission medications   ?Medication Sig Start Date End Date Taking? Authorizing Provider  ?psyllium (METAMUCIL) 58.6 % packet Take 1 packet by mouth daily.    [provider]  ? ? ?Current Outpatient Medications  ?Medication Sig Dispense Refill  ? psyllium (METAMUCIL) 58.6 % packet Take 1 packet by mouth daily.    ? ?Current Facility-Administered Medications  ?Medication Dose Route Frequency Provider Last Rate Last Admin  ? 0.9 %  sodium chloride infusion  500 mL Intravenous Once Mykia Holton, Carlota Raspberry, MD      ? ? ?Allergies as of 06/06/2021  ? (No Known Allergies)  ? ? ?Family History  ?Problem Relation Age of Onset  ? Basal cell carcinoma Mother   ?     several  ? Prostate cancer Father   ?     dx 85s  ? Hyperlipidemia Father   ? Hypertension Father   ? Colon polyps Father 3  ?  unknown number  ? Kidney cancer Father   ?     dx 41s  ? Colon cancer Paternal Aunt   ?     dx in early 59's  ? Heart disease Maternal Grandfather   ? Stroke Maternal Grandfather   ? Colon cancer Paternal Grandmother   ?     Dx in her 66's  ? Colon polyps Brother   ?     <10  ? Alzheimer's disease Paternal Grandfather   ? Esophageal cancer Neg Hx   ? Rectal cancer Neg Hx   ? Stomach cancer Neg Hx   ? ? ?Social History  ? ?Socioeconomic History  ? Marital status: Married  ?  Spouse name: Not on file  ? Number of children: Not on file  ? Years of education: Not on file  ? Highest education level: Not on file  ?Occupational History  ? Not on file  ?Tobacco Use  ? Smoking status: Former  ?  Packs/day: 0.25  ?  Years: 10.00  ?  Pack years: 2.50  ?  Types: Cigarettes  ?  Quit date: 03/31/2001  ?  Years since quitting: 20.1  ?   Passive exposure: Never  ? Smokeless tobacco: Never  ?Vaping Use  ? Vaping Use: Never used  ?Substance and Sexual Activity  ? Alcohol use: Yes  ?  Alcohol/week: 6.0 standard drinks  ?  Types: 6 Cans of beer per week  ?  Comment: 2 x a week  ? Drug use: No  ? Sexual activity: Not on file  ?Other Topics Concern  ? Not on file  ?Social History Narrative  ? Married, no children.  ? Orig from [place].  ? Educ: BS  ? Occup: Banking  ? Minimal tob use in the past--quit 2003.  ? Alc: rare.   ? ?Social Determinants of Health  ? ?Financial Resource Strain: Not on file  ?Food Insecurity: Not on file  ?Transportation Needs: Not on file  ?Physical Activity: Not on file  ?Stress: Not on file  ?Social Connections: Not on file  ?Intimate Partner Violence: Not on file  ? ? ?Review of Systems: ?All other review of systems negative except as mentioned in the HPI. ? ?Physical Exam: ?Vital signs ?BP 121/75   Pulse (!) 54   Temp 97.8 ?F (36.6 ?C) (Temporal)   Ht 5' 10.5" (1.791 m)   Wt 185 lb (83.9 kg)   SpO2 99%   BMI 26.17 kg/m?  ? ?General:   Alert,  Well-developed, pleasant and cooperative in NAD ?Lungs:  Clear throughout to auscultation.   ?Heart:  Regular rate and rhythm ?Abdomen:  Soft, nontender and nondistended.   ?Neuro/Psych:  Alert and cooperative. Normal mood and affect. A and O x 3 ? ?Jolly Mango, MD ?[facility] ? ? ?

## 2021-06-06 NOTE — Op Note (Signed)
[facility] ?Patient Name: Jay Ward ?Procedure Date: 06/06/2021 8:52 AM ?MRN: [id number] ?Endoscopist: Carlota Raspberry. Havery [name] , MD ?Age: 51 ?Referring MD:  ?Date of Birth: 07-Aug-1970 ?Gender: Male ?Account #: [id number] ?Procedure:                Colonoscopy ?Indications:              High risk colon cancer surveillance: Personal  ?                          history of colon cancer - 10/21 - 13 adenomas and  ?                          rectal cancer, flex sig 6/22 showed no recurrent  ?                          malignancy. 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                          and MSH6 g ?Medicines:                Monitored Anesthesia Care ?Procedure:                Pre-Anesthesia Assessment: ?                          - Prior to the procedure, a History and Physical  ?                          was performed, and patient medications and  ?                          allergies were reviewed. The patient's tolerance of  ?                          previous anesthesia was also reviewed. The risks  ?                          and benefits of the procedure and the sedation  ?                          options and risks were discussed with the patient.  ?                          All questions were answered, and informed consent  ?                          was obtained. Prior Anticoagulants: The patient has  ?                          taken no previous anticoagulant or antiplatelet  ?                          agents. ASA Grade Assessment: II - A patient with  ?  mild systemic disease. After reviewing the risks  ?                          and benefits, the patient was deemed in  ?                          satisfactory condition to undergo the procedure. ?                          After obtaining informed consent, the colonoscope  ?                          was passed under direct vision. Throughout the  ?                          procedure, the patient's blood pressure, pulse, and   ?                          oxygen saturations were monitored continuously. The  ?                          Olympus CF-HQ190L (Serial# [id number]) Colonoscope was  ?                          introduced through the anus and advanced to the the  ?                          terminal ileum, with identification of the  ?                          appendiceal orifice and IC valve. The colonoscopy  ?                          was performed without difficulty. The patient  ?                          tolerated the procedure well. The quality of the  ?                          bowel preparation was good. The terminal ileum,  ?                          ileocecal valve, appendiceal orifice, and rectum  ?                          were photographed. ?Scope In: 9:08:39 AM ?Scope Out: 9:28:33 AM ?Scope Withdrawal Time: 0 hours 16 minutes 21 seconds  ?Total Procedure Duration: 0 hours 19 minutes 54 seconds  ?Findings:                 The perianal and digital rectal examinations were  ?                          normal. ?  The terminal ileum appeared normal. ?                          A diminutive polyp was found in the cecum. The  ?                          polyp was sessile. The polyp was removed with a  ?                          cold snare. Resection and retrieval were complete. ?                          A 3 mm polyp was found in the ascending colon. The  ?                          polyp was flat. The polyp was removed with a cold  ?                          snare. Resection and retrieval were complete. ?                          A 4 mm polyp was found in the hepatic flexure. The  ?                          polyp was sessile. The polyp was removed with a  ?                          cold snare. Resection and retrieval were complete. ?                          A 4 mm polyp was found in the transverse colon. The  ?                          polyp was sessile. The polyp was removed with a  ?                           cold snare. Resection and retrieval were complete. ?                          A 3 mm polyp was found in the rectum. The polyp was  ?                          sessile. The polyp was removed with a cold snare.  ?                          Resection and retrieval were complete. ?                          A few small-mouthed diverticula were found in the  ?                          sigmoid  colon and ascending colon. ?                          Internal hemorrhoids were found during retroflexion. ?                          The exam was otherwise without abnormality. There  ?                          is a well healed scar in the distal rectum /  ?                          dentate line without any recurrent malignant /  ?                          polypoid tissue ?Complications:            No immediate complications. Estimated blood loss:  ?                          Minimal. ?Estimated Blood Loss:     Estimated blood loss was minimal. ?Impression:               - The examined portion of the ileum was normal. ?                          - One diminutive polyp in the cecum, removed with a  ?                          cold snare. Resected and retrieved. ?                          - One 3 mm polyp in the ascending colon, removed  ?                          with a cold snare. Resected and retrieved. ?                          - One 4 mm polyp at the hepatic flexure, removed  ?                          with a cold snare. Resected and retrieved. ?                          - One 4 mm polyp in the transverse colon, removed  ?                          with a cold snare. Resected and retrieved. ?                          - One 3 mm polyp in the rectum, removed with a cold  ?                          snare. Resected and retrieved. ?                          -  Diverticulosis in the sigmoid colon and in the  ?                          ascending colon. ?                          - Internal hemorrhoids. ?                          - The  examination was otherwise normal. ?                          - Well healed rectal scar without recurrence ?Recommendation:           - Patient has a contact number available for  ?                          emergencies. The signs and symptoms of potential  ?                          delayed complications were discussed with the  ?                          patient. Return to normal activities tomorrow.  ?                          Written discharge instructions were provided to the  ?                          patient. ?                          - Resume previous diet. ?                          - Continue present medications. ?                          - Await pathology results. ?Carlota Raspberry. Havery [name], MD ?06/06/2021 9:35:38 AM ?This report has been signed electronically. ?

## 2021-06-06 NOTE — Progress Notes (Signed)
Called to room to assist during endoscopic procedure.  Patient ID and intended procedure confirmed with present staff. Received instructions for my participation in the procedure from the performing physician.  

## 2021-06-06 NOTE — Patient Instructions (Signed)
Thank you for allowing [place] to care for you today. ?Await pathology, approximately 7-10 days.  Will make recommendation for future colonoscopy. ?Resume previous diet and medications today. ?Return to normal daily activities tomorrow. ? ? ?YOU HAD AN ENDOSCOPIC PROCEDURE TODAY AT [facility]:   Refer to the procedure report that was given to you for any specific questions about what was found during the examination.  If the procedure report does not answer your questions, please call your gastroenterologist to clarify.  If you requested that your care partner not be given the details of your procedure findings, then the procedure report has been included in a sealed envelope for you to review at your convenience later. ? ?YOU SHOULD EXPECT: Some feelings of bloating in the abdomen. Passage of more gas than usual.  Walking can help get rid of the air that was put into your GI tract during the procedure and reduce the bloating. If you had a lower endoscopy (such as a colonoscopy or flexible sigmoidoscopy) you may notice spotting of blood in your stool or on the toilet paper. If you underwent a bowel prep for your procedure, you may not have a normal bowel movement for a few days. ? ?Please Note:  You might notice some irritation and congestion in your nose or some drainage.  This is from the oxygen used during your procedure.  There is no need for concern and it should clear up in a day or so. ? ?SYMPTOMS TO REPORT IMMEDIATELY: ? ?Following lower endoscopy (colonoscopy or flexible sigmoidoscopy): ? Excessive amounts of blood in the stool ? Significant tenderness or worsening of abdominal pains ? Swelling of the abdomen that is new, acute ? Fever of 100?F or higher ? ? ? ?For urgent or emergent issues, a gastroenterologist can be reached at any hour by calling [phone]. ?Do not use MyChart messaging for urgent concerns.  ? ? ?DIET:  We do recommend a small meal at first, but then you may proceed to  your regular diet.  Drink plenty of fluids but you should avoid alcoholic beverages for 24 hours. ? ?ACTIVITY:  You should plan to take it easy for the rest of today and you should NOT DRIVE or use heavy machinery until tomorrow (because of the sedation medicines used during the test).   ? ?FOLLOW UP: ?Our staff will call the number listed on your records 48-72 hours following your procedure to check on you and address any questions or concerns that you may have regarding the information given to you following your procedure. If we do not reach you, we will leave a message.  We will attempt to reach you two times.  During this call, we will ask if you have developed any symptoms of COVID 19. If you develop any symptoms (ie: fever, flu-like symptoms, shortness of breath, cough etc.) before then, please call [phone].  If you test positive for Covid 19 in the 2 weeks post procedure, please call and report this information to [place].   ? ?If any biopsies were taken you will be contacted by phone or by letter within the next 1-3 weeks.  Please call us at [phone] if you have not heard about the biopsies in 3 weeks.  ? ? ?SIGNATURES/CONFIDENTIALITY: ?You and/or your care partner have signed paperwork which will be entered into your electronic medical record.  These signatures attest to the fact that that the information above on your After Visit Summary has been reviewed and  is understood.  Full responsibility of the confidentiality of this discharge information lies with you and/or your care-partner.  ?

## 2021-06-06 NOTE — Progress Notes (Signed)
Sedate, gd SR, tolerated procedure well, VSS, report to RN 

## 2021-06-10 ENCOUNTER — Telehealth: Payer: Self-pay

## 2021-06-10 NOTE — Telephone Encounter (Signed)
Left message on follow up call. 

## 2021-06-10 NOTE — Telephone Encounter (Signed)
?  Follow up Call- ? ?Call back number 06/06/2021 09/12/2020 12/30/2019  ?Post procedure Call Back phone  # [phone]  ?Permission to leave phone message Yes Yes Yes  ?Some recent data might be hidden  ?  ? ?Patient questions: ? ?Do you have a fever, pain , or abdominal swelling? No. ?Pain Score  0 * ? ?Have you tolerated food without any problems? Yes.   ? ?Have you been able to return to your normal activities? Yes.   ? ?Do you have any questions about your discharge instructions: ?Diet   No. ?Medications  No. ?Follow up visit  No. ? ?Do you have questions or concerns about your Care? No. ? ?Actions: ?* If pain score is 4 or above: ?No action needed, pain <4. ? ? ?

## 2021-09-10 ENCOUNTER — Other Ambulatory Visit: Payer: Self-pay | Admitting: Surgery

## 2021-09-10 DIAGNOSIS — Z08 Encounter for follow-up examination after completed treatment for malignant neoplasm: Secondary | ICD-10-CM

## 2021-09-10 DIAGNOSIS — C20 Malignant neoplasm of rectum: Secondary | ICD-10-CM

## 2021-09-27 ENCOUNTER — Ambulatory Visit (INDEPENDENT_AMBULATORY_CARE_PROVIDER_SITE_OTHER): Payer: BC Managed Care – PPO | Admitting: Family Medicine

## 2021-09-27 VITALS — BP 114/80 | HR 54 | Temp 98.2°F | Ht 70.5 in | Wt 189.4 lb

## 2021-09-27 DIAGNOSIS — T7840XA Allergy, unspecified, initial encounter: Secondary | ICD-10-CM

## 2021-09-27 DIAGNOSIS — L237 Allergic contact dermatitis due to plants, except food: Secondary | ICD-10-CM

## 2021-09-27 MED ORDER — EPINEPHRINE 0.3 MG/0.3ML IJ SOAJ: 0.3000 mg | INTRAMUSCULAR | 1 refills | Status: AC | PRN

## 2021-09-27 MED ORDER — FLUTICASONE PROPIONATE 0.05 % EX CREA: TOPICAL_CREAM | CUTANEOUS | 1 refills | Status: DC

## 2021-09-27 NOTE — Progress Notes (Signed)
OFFICE VISIT  09/27/2021  CC:  Chief Complaint  Patient presents with   Rash    Located on back; 2 days.  Redness, itchy and irritation and puffy. Has used benadryl cream and Zanfel for poison ivy   Patient is a 51 y.o. male who presents for rash.  HPI: Jay Ward camping recently. Got into some poison ivy and got a few patches on his arms. Then a couple days later got a large patch of itchy pinkish rash on the left lower back.  This was a larger patch and itches a lot. He has no pain He feels well.  He recently had a significant local reaction to some yellowjacket stings (2-3). He notes that it was a marked increase in the pain and swelling compared to prior yellowjacket stings and the erythema and swelling at the site of the stings took over a week to resolve All symptoms from this are resolved now.Marland Kitchen He did not have any swelling of his lips, tongue, eyes, or face with the stings.  Past Medical History:  Diagnosis Date   Allergy    seasonal   Basal cell carcinoma 10/12/2019   Cancer of multiple primary sites [facility])    genetics referral by gen surg 51/70/01   Complication of anesthesia    Can not have 100% oxygen due to previous chemo tx   Family history of basal cell carcinoma    Family history of colon cancer    Family history of kidney cancer    Family history of prostate cancer in father    Hay fever    History of colon polyps    multiple   Inguinal hernia    Right   Melanoma ([place]) 2014   on his back.  Also R thigh with dysplastic nevus with moderate atypia 02/2016.  He sees Derm (Dr. Allyson Sabal) q 6 mo: as of 08/2017 derm f/u all good.   Rectal adenocarcinoma ([place]) 01/2020   surgery 02/09/20, no adjuvant therapy.   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    Past Surgical History:  Procedure Laterality Date   APPENDECTOMY  1990   COLONOSCOPY  12/2019   FLEXIBLE SIGMOIDOSCOPY   09/12/2020   3 polyps; recall 02/2021 for full colonoscopy d/t high burden of polyps on colonoscopy 12/2019.   INGUINAL HERNIA REPAIR Right    MELANOMA EXCISION     with lymph nodes   MOHS SURGERY     ORCHIECTOMY Left 2003   RECTAL EXAM UNDER ANESTHESIA N/A 02/09/2020   Procedure: RECTAL EXAM UNDER ANESTHESIA;  Surgeon: Ileana Roup, MD;  Location: [facility];  Service: General;  Laterality: N/A;   SURGERY SCROTAL / TESTICULAR     had hernia repair at the same   TRANSANAL EXCISION OF RECTAL MASS N/A 02/09/2020   Procedure: anorectal exam under anesthesia  excision left anorectal polpoid lesion;  Surgeon: Ileana Roup, MD;  Location: [facility];  Service: General;  Laterality: N/A;    Outpatient Medications Prior to Visit  Medication Sig Dispense Refill   psyllium (METAMUCIL) 58.6 % packet Take 1 packet by mouth daily.     No facility-administered medications prior to visit.    No Known Allergies  ROS As per HPI  PE:    09/27/2021    3:13 PM 06/06/2021    9:50 AM 06/06/2021    9:41 AM  Vitals with BMI  Height 5' 10.5"  Weight 189 lbs 6 oz    BMI 11.94    Systolic 174 081 448  Diastolic 80 69 59  Pulse 54 40 47   Physical Exam  Gen: Alert, well appearing.  Patient is oriented to person, place, time, and situation. AFFECT: pleasant, lucid thought and speech. A few patches of deep red papular rash on the forearms, each about 2- 4 cm diameter. Similar-appearing larger confluent patch of rough papular rash on the left lower back. No vesicles.  No target lesions.   LABS:  Last CBC Lab Results  Component Value Date   WBC 4.5 10/26/2020   HGB 13.7 10/26/2020   HCT 41.2 10/26/2020   MCV 87.3 10/26/2020   MCH 29.5 01/27/2020   RDW 14.6 10/26/2020   PLT 228.0 18/56/3149   Last metabolic panel Lab Results  Component Value Date   GLUCOSE 90 10/26/2020   NA 141 10/26/2020   K 4.4 10/26/2020   CL 106 10/26/2020   CO2 24 10/26/2020   BUN 16 10/26/2020    CREATININE 0.92 10/26/2020   CALCIUM 9.3 10/26/2020   PROT 6.7 10/26/2020   ALBUMIN 4.4 10/26/2020   BILITOT 0.5 10/26/2020   ALKPHOS 59 10/26/2020   AST 21 10/26/2020   ALT 16 10/26/2020   IMPRESSION AND PLAN:  #1 contact dermatitis.,  Poison ivy/sumac. Cutivate 0.05% cream twice daily to affected areas. Antihistamine as needed  #2 potential allergic reaction to yellowjacket stings. He seems to have had an escalated local response to his most recent yellowjacket stings. We will provide EpiPen for potential future use.  An After Visit Summary was printed and given to the patient.  FOLLOW UP: No follow-ups on file.  Signed:  Crissie Sickles, MD           09/27/2021

## 2021-10-07 ENCOUNTER — Telehealth: Payer: Self-pay | Admitting: Family Medicine

## 2021-10-07 ENCOUNTER — Ambulatory Visit (INDEPENDENT_AMBULATORY_CARE_PROVIDER_SITE_OTHER): Payer: BC Managed Care – PPO | Admitting: Family Medicine

## 2021-10-07 ENCOUNTER — Encounter: Payer: Self-pay | Admitting: Family Medicine

## 2021-10-07 VITALS — BP 120/76 | HR 47 | Temp 97.9°F | Ht 70.5 in | Wt 192.8 lb

## 2021-10-07 DIAGNOSIS — L237 Allergic contact dermatitis due to plants, except food: Secondary | ICD-10-CM

## 2021-10-07 MED ORDER — PREDNISONE 10 MG PO TABS: ORAL_TABLET | ORAL | 0 refills | Status: DC

## 2021-10-07 NOTE — Telephone Encounter (Signed)
Pt scheduled for 4pm. 

## 2021-10-07 NOTE — Telephone Encounter (Signed)
I prefer he come in for an office visit today to let me check the rash out again and we will decide what to do. 4 or 415 today

## 2021-10-07 NOTE — Telephone Encounter (Signed)
Pt called stating that the rash he was recently seen for was getting better but on Friday he went for a run and the rash started to worsen again. Pt wants to know if something else can be sent to the pharmacy for him or if he can come in to have the steroid shot that he declined prior.   Please advise further

## 2021-10-07 NOTE — Progress Notes (Signed)
OFFICE VISIT  10/07/2021  CC:  Chief Complaint  Patient presents with   Follow-up    Rash; expanded Sat and has new areas. Finished using cream prescribed this morning    Patient is a 51 y.o. male who presents for ongoing rash. I initially saw him for this 11 days ago. A/P as of that visit: "#1 contact dermatitis.,  Poison ivy/sumac. Cutivate 0.05% cream twice daily to affected areas. Antihistamine as needed   #2 potential allergic reaction to yellowjacket stings. He seems to have had an escalated local response to his most recent yellowjacket stings. We will provide EpiPen for potential future use."  INTERIM HX: His rash had significantly improved until a couple of days ago he went for a 2 mile run on his treadmill. The next morning he developed a new splotch of rash in the side in the area over the left iliac region returned and is a bit larger--- very itchy.  he ran out of his steroid cream today. The little spots on his arms seem to have continued to clear up.  Past Medical History:  Diagnosis Date   Allergy    seasonal   Basal cell carcinoma 10/12/2019   Cancer of multiple primary sites [facility])    genetics referral by gen surg 67/34/19   Complication of anesthesia    Can not have 100% oxygen due to previous chemo tx   Family history of basal cell carcinoma    Family history of colon cancer    Family history of kidney cancer    Family history of prostate cancer in father    Hay fever    History of colon polyps    multiple   Inguinal hernia    Right   Melanoma ([place]) 2014   on his back.  Also R thigh with dysplastic nevus with moderate atypia 02/2016.  He sees Derm (Dr. Allyson Sabal) q 6 mo: as of 08/2017 derm f/u all good.   Rectal adenocarcinoma ([place]) 01/2020   surgery 02/09/20, no adjuvant therapy.   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    Past Surgical History:  Procedure  Laterality Date   APPENDECTOMY  1990   COLONOSCOPY  12/2019   FLEXIBLE SIGMOIDOSCOPY  09/12/2020   3 polyps; recall 02/2021 for full colonoscopy d/t high burden of polyps on colonoscopy 12/2019.   INGUINAL HERNIA REPAIR Right    MELANOMA EXCISION     with lymph nodes   MOHS SURGERY     ORCHIECTOMY Left 2003   RECTAL EXAM UNDER ANESTHESIA N/A 02/09/2020   Procedure: RECTAL EXAM UNDER ANESTHESIA;  Surgeon: Ileana Roup, MD;  Location: [facility];  Service: General;  Laterality: N/A;   SURGERY SCROTAL / TESTICULAR     had hernia repair at the same   TRANSANAL EXCISION OF RECTAL MASS N/A 02/09/2020   Procedure: anorectal exam under anesthesia  excision left anorectal polpoid lesion;  Surgeon: Ileana Roup, MD;  Location: [facility];  Service: General;  Laterality: N/A;    Outpatient Medications Prior to Visit  Medication Sig Dispense Refill   psyllium (METAMUCIL) 58.6 % packet Take 1 packet by mouth daily.     fluticasone (CUTIVATE) 0.05 % cream Apply to affected areas bid 30 g 1   EPINEPHrine (EPIPEN 2-PAK) 0.3 mg/0.3 mL IJ SOAJ injection Inject 0.3 mg into the muscle as needed for anaphylaxis. (Patient not taking: Reported on 10/07/2021) 2  each 1   No facility-administered medications prior to visit.    No Known Allergies  ROS As per HPI  PE:    10/07/2021    4:13 PM 10/07/2021    4:05 PM 09/27/2021    3:13 PM  Vitals with BMI  Height  5' 10.5" 5' 10.5"  Weight  192 lbs 13 oz 189 lbs 6 oz  BMI  67.54 49.20  Systolic 100 712 197  Diastolic 76 75 80  Pulse  47 54     Physical Exam  Gen: Alert, well appearing.  Patient is oriented to person, place, time, and situation. AFFECT: pleasant, lucid thought and speech. Skin: Large confluent patch of blanching macular rash in the left low back/iliac region and a similar but smaller rash is noted on the lateral rib cage on the left.  This has just a hint of a papular component. No vesicles or pustules.   LABS:   none  IMPRESSION AND PLAN:  Rash. He showed me some pictures from a couple days ago and it does look more convincingly like contact dermatitis than it does on today's exam. Decided to do a course of prednisone: 40 mg a day x4 days, then 20 mg a day x4 days, then 10 mg daily x4 days. Okay to continue to topical antihistamine ointment as patient states this has brought him some relief.  An After Visit Summary was printed and given to the patient.  FOLLOW UP: Return if symptoms worsen or fail to improve.  Signed:  Crissie Sickles, MD           10/07/2021

## 2021-10-30 ENCOUNTER — Ambulatory Visit (INDEPENDENT_AMBULATORY_CARE_PROVIDER_SITE_OTHER): Payer: BC Managed Care – PPO | Admitting: Family Medicine

## 2021-10-30 ENCOUNTER — Encounter: Payer: Self-pay | Admitting: Family Medicine

## 2021-10-30 VITALS — BP 123/76 | HR 45 | Temp 97.6°F | Ht 71.0 in | Wt 190.2 lb

## 2021-10-30 DIAGNOSIS — Z125 Encounter for screening for malignant neoplasm of prostate: Secondary | ICD-10-CM | POA: Diagnosis not present

## 2021-10-30 DIAGNOSIS — Z23 Encounter for immunization: Secondary | ICD-10-CM

## 2021-10-30 DIAGNOSIS — Z0000 Encounter for general adult medical examination without abnormal findings: Secondary | ICD-10-CM

## 2021-10-30 LAB — COMPREHENSIVE METABOLIC PANEL
ALT: 20 U/L (ref 0–53)
AST: 22 U/L (ref 0–37)
Albumin: 4.6 g/dL (ref 3.5–5.2)
Alkaline Phosphatase: 58 U/L (ref 39–117)
BUN: 14 mg/dL (ref 6–23)
CO2: 27 mEq/L (ref 19–32)
Calcium: 9.7 mg/dL (ref 8.4–10.5)
Chloride: 105 mEq/L (ref 96–112)
Creatinine, Ser: 1.04 mg/dL (ref 0.40–1.50)
GFR: 84.63 mL/min (ref >60.00)
Glucose, Bld: 95 mg/dL (ref 70–99)
Potassium: 4.4 mEq/L (ref 3.5–5.1)
Sodium: 141 mEq/L (ref 135–145)
Total Bilirubin: 0.6 mg/dL (ref 0.2–1.2)
Total Protein: 7 g/dL (ref 6.0–8.3)

## 2021-10-30 LAB — CBC WITH DIFFERENTIAL/PLATELET
Basophils Absolute: 0 10*3/uL (ref 0.0–0.1)
Basophils Relative: 0.7 % (ref 0.0–3.0)
Eosinophils Absolute: 0.1 10*3/uL (ref 0.0–0.7)
Eosinophils Relative: 2.7 % (ref 0.0–5.0)
HCT: 42.4 % (ref 39.0–52.0)
Hemoglobin: 14.1 g/dL (ref 13.0–17.0)
Lymphocytes Relative: 39.9 % (ref 12.0–46.0)
Lymphs Abs: 1.8 10*3/uL (ref 0.7–4.0)
MCHC: 33.3 g/dL (ref 30.0–36.0)
MCV: 86.8 fl (ref 78.0–100.0)
Monocytes Absolute: 0.4 10*3/uL (ref 0.1–1.0)
Monocytes Relative: 8.1 % (ref 3.0–12.0)
Neutro Abs: 2.2 10*3/uL (ref 1.4–7.7)
Neutrophils Relative %: 48.6 % (ref 43.0–77.0)
Platelets: 228 10*3/uL (ref 150.0–400.0)
RBC: 4.88 Mil/uL (ref 4.22–5.81)
RDW: 14 % (ref 11.5–15.5)
WBC: 4.5 10*3/uL (ref 4.0–10.5)

## 2021-10-30 LAB — LIPID PANEL
Cholesterol: 226 mg/dL — ABNORMAL HIGH (ref 0–200)
HDL: 76.2 mg/dL (ref >39.00)
LDL Cholesterol: 139 mg/dL — ABNORMAL HIGH (ref 0–99)
NonHDL: 149.76
Total CHOL/HDL Ratio: 3
Triglycerides: 53 mg/dL (ref 0.0–149.0)
VLDL: 10.6 mg/dL (ref 0.0–40.0)

## 2021-10-30 LAB — TSH: TSH: 1.16 u[IU]/mL (ref 0.35–5.50)

## 2021-10-30 LAB — PSA: PSA: 1.03 ng/mL (ref 0.10–4.00)

## 2021-10-30 NOTE — Progress Notes (Signed)
Office Note 10/30/2021  CC:  Chief Complaint  Patient presents with   Annual Exam    Pt is fasting   HPI:  Patient is a 51 y.o. male who is here for annual health maintenance exam. Feeling well.  Rash is completely resolved now and he has been back into running routine for a couple weeks.   Past Medical History:  Diagnosis Date   Allergy    seasonal   Basal cell carcinoma 10/12/2019   Cancer of multiple primary sites [facility])    genetics referral by gen surg 74/25/95   Complication of anesthesia    Can not have 100% oxygen due to previous chemo tx   Family history of basal cell carcinoma    Family history of colon cancer    Family history of kidney cancer    Family history of prostate cancer in father    Hay fever    History of colon polyps    multiple   Inguinal hernia    Right   Melanoma ([place]) 2014   on his back.  Also R thigh with dysplastic nevus with moderate atypia 02/2016.  He sees Derm (Dr. Allyson Sabal) q 6 mo: as of 08/2017 derm f/u all good.   Rectal adenocarcinoma ([place]) 01/2020   surgery 02/09/20, no adjuvant therapy.   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    Past Surgical History:  Procedure Laterality Date   APPENDECTOMY  1990   COLONOSCOPY  12/2019   FLEXIBLE SIGMOIDOSCOPY  09/12/2020   3 polyps; recall 02/2021 for full colonoscopy d/t high burden of polyps on colonoscopy 12/2019.   INGUINAL HERNIA REPAIR Right    MELANOMA EXCISION     with lymph nodes   MOHS SURGERY     ORCHIECTOMY Left 2003   RECTAL EXAM UNDER ANESTHESIA N/A 02/09/2020   Procedure: RECTAL EXAM UNDER ANESTHESIA;  Surgeon: Ileana Roup, MD;  Location: [facility];  Service: General;  Laterality: N/A;   SURGERY SCROTAL / TESTICULAR     had hernia repair at the same   TRANSANAL EXCISION OF RECTAL MASS N/A 02/09/2020   Procedure: anorectal exam under anesthesia  excision left anorectal polpoid  lesion;  Surgeon: Ileana Roup, MD;  Location: [facility];  Service: General;  Laterality: N/A;    Family History  Problem Relation Age of Onset   Basal cell carcinoma Mother        several   Prostate cancer Father        dx 32s   Hyperlipidemia Father    Hypertension Father    Colon polyps Father 76       unknown number   Kidney cancer Father        dx 69s   Colon cancer Paternal Aunt        dx in early 70's   Heart disease Maternal Grandfather    Stroke Maternal Grandfather    Colon cancer Paternal Grandmother        Dx in her 51's   Colon polyps Brother        <10   Alzheimer's disease Paternal Grandfather    Esophageal cancer Neg Hx    Rectal cancer Neg Hx    Stomach cancer Neg Hx     Social History   Socioeconomic History   Marital status: Married    Spouse name: Not on file   Number of children: Not on  file   Years of education: Not on file   Highest education level: Bachelor's degree (e.g., BA, AB, BS)  Occupational History   Not on file  Tobacco Use   Smoking status: Former    Packs/day: 0.25    Years: 10.00    Total pack years: 2.50    Types: Cigarettes    Quit date: 03/31/2001    Years since quitting: 20.5    Passive exposure: Never   Smokeless tobacco: Never  Vaping Use   Vaping Use: Never used  Substance and Sexual Activity   Alcohol use: Yes    Alcohol/week: 6.0 standard drinks of alcohol    Types: 6 Cans of beer per week    Comment: 2 x a week   Drug use: No   Sexual activity: Not on file  Other Topics Concern   Not on file  Social History Narrative   Married, no children.   Orig from [place].   Educ: BS   Occup: Banking   Minimal tob use in the past--quit 2003.   Alc: rare.    Social Determinants of Health   Financial Resource Strain: Low Risk  (09/27/2021)   Overall Financial Resource Strain (CARDIA)    Difficulty of Paying Living Expenses: Not hard at all  Food Insecurity: No Food Insecurity (09/27/2021)   Hunger Vital Sign     Worried About Running Out of Food in the Last Year: Never true    Ran Out of Food in the Last Year: Never true  Transportation Needs: No Transportation Needs (09/27/2021)   PRAPARE - Hydrologist (Medical): No    Lack of Transportation (Non-Medical): No  Physical Activity: Sufficiently Active (09/27/2021)   Exercise Vital Sign    Days of Exercise per Week: 7 days    Minutes of Exercise per Session: 40 min  Stress: No Stress Concern Present (09/27/2021)   [place]    Feeling of Stress : Only a little  Social Connections: Unknown (09/27/2021)   Social Connection and Isolation Panel [NHANES]    Frequency of Communication with Friends and Family: Patient refused    Frequency of Social Gatherings with Friends and Family: Three times a week    Attends Religious Services: Patient refused    Active Member of Clubs or Organizations: Patient refused    Attends Archivist Meetings: Not on file    Marital Status: Married  Human resources officer Violence: Not on file    Outpatient Medications Prior to Visit  Medication Sig Dispense Refill   psyllium (METAMUCIL) 58.6 % packet Take 1 packet by mouth daily.     EPINEPHrine (EPIPEN 2-PAK) 0.3 mg/0.3 mL IJ SOAJ injection Inject 0.3 mg into the muscle as needed for anaphylaxis. (Patient not taking: Reported on 10/07/2021) 2 each 1   predniSONE (DELTASONE) 10 MG tablet 4 tabs po qd x 4d, then 2 tabs po qd x 4d, then 1 tab po qd x 4d 28 tablet 0   No facility-administered medications prior to visit.    No Known Allergies  ROS Review of Systems  Constitutional:  Negative for appetite change, chills, fatigue and fever.  HENT:  Negative for congestion, dental problem, ear pain and sore throat.   Eyes:  Negative for discharge, redness and visual disturbance.  Respiratory:  Negative for cough, chest tightness, shortness of breath and wheezing.    Cardiovascular:  Negative for chest pain, palpitations and leg swelling.  Gastrointestinal:  Negative for abdominal pain, blood in stool, diarrhea, nausea and vomiting.  Genitourinary:  Negative for difficulty urinating, dysuria, flank pain, frequency, hematuria and urgency.  Musculoskeletal:  Negative for arthralgias, back pain, joint swelling, myalgias and neck stiffness.  Skin:  Negative for pallor and rash.  Neurological:  Negative for dizziness, speech difficulty, weakness and headaches.  Hematological:  Negative for adenopathy. Does not bruise/bleed easily.  Psychiatric/Behavioral:  Negative for confusion and sleep disturbance. The patient is not nervous/anxious.     PE;    10/30/2021    8:57 AM 10/07/2021    4:13 PM 10/07/2021    4:05 PM  Vitals with BMI  Height '5\' 11"'$   5' 10.5"  Weight 190 lbs 3 oz  192 lbs 13 oz  BMI 37.34  28.76  Systolic 811 572 620  Diastolic 76 76 75  Pulse 45  47    Gen: Alert, well appearing.  Patient is oriented to person, place, time, and situation. AFFECT: pleasant, lucid thought and speech. ENT: Ears: EACs clear, normal epithelium.  TMs with good light reflex and landmarks bilaterally.  Eyes: no injection, icteris, swelling, or exudate.  EOMI, PERRLA. Nose: no drainage or turbinate edema/swelling.  No injection or focal lesion.  Mouth: lips without lesion/swelling.  Oral mucosa pink and moist.  Dentition intact and without obvious caries or gingival swelling.  Oropharynx without erythema, exudate, or swelling.  Neck: supple/nontender.  No LAD, mass, or TM.  Carotid pulses 2+ bilaterally, without bruits. CV: RRR, no m/r/g.   LUNGS: CTA bilat, nonlabored resps, good aeration in all lung fields. ABD: soft, NT, ND, BS normal.  No hepatospenomegaly or mass.  No bruits. EXT: no clubbing, cyanosis, or edema.  Musculoskeletal: no joint swelling, erythema, warmth, or tenderness.  ROM of all joints intact. Skin - no sores or suspicious lesions or rashes or  color changes  Pertinent labs:  Lab Results  Component Value Date   TSH 1.04 10/26/2020   Lab Results  Component Value Date   WBC 4.5 10/26/2020   HGB 13.7 10/26/2020   HCT 41.2 10/26/2020   MCV 87.3 10/26/2020   PLT 228.0 10/26/2020   Lab Results  Component Value Date   CREATININE 0.92 10/26/2020   BUN 16 10/26/2020   NA 141 10/26/2020   K 4.4 10/26/2020   CL 106 10/26/2020   CO2 24 10/26/2020   Lab Results  Component Value Date   ALT 16 10/26/2020   AST 21 10/26/2020   ALKPHOS 59 10/26/2020   BILITOT 0.5 10/26/2020   Lab Results  Component Value Date   CHOL 201 (H) 10/26/2020   Lab Results  Component Value Date   HDL 70.30 10/26/2020   Lab Results  Component Value Date   LDLCALC 120 (H) 10/26/2020   Lab Results  Component Value Date   TRIG 54.0 10/26/2020   Lab Results  Component Value Date   CHOLHDL 3 10/26/2020   Lab Results  Component Value Date   PSA 1.01 10/26/2020   PSA 0.98 10/26/2019   PSA 0.91 10/19/2018   ASSESSMENT AND PLAN:   No problem-specific Assessment & Plan notes found for this encounter.   Health maintenance exam: Reviewed age and gender appropriate health maintenance issues (prudent diet, regular exercise, health risks of tobacco and excessive alcohol, use of seatbelts, fire alarms in home, use of sunscreen).  Also reviewed age and gender appropriate health screening as well as vaccine recommendations. Vaccines:  Tdap-->given today.  Otherwise ALL UTD. Labs: fasting HP +  PSA. Prostate ca screening: PSA today. Colon ca screening: pt followed closely by GI and general surgery for his recent hx of rectal adenocarcinoma back in 2021.  Most recent colonoscopy was 06/06/2021 and this showed 4 polyps that were adenomatous, recall in 2 to 3 years. Skin ca screening: he gets quite frequent derm eval's d/t hx of melanoma and BCC.  An After Visit Summary was printed and given to the patient.  FOLLOW UP:  No follow-ups on file.  Signed:   Crissie Sickles, MD           10/30/2021

## 2021-10-30 NOTE — Patient Instructions (Signed)
Health Maintenance, Male Adopting a healthy lifestyle and getting preventive care are important in promoting health and wellness. Ask your health care provider about: The right schedule for you to have regular tests and exams. Things you can do on your own to prevent diseases and keep yourself healthy. What should I know about diet, weight, and exercise? Eat a healthy diet  Eat a diet that includes plenty of vegetables, fruits, low-fat dairy products, and lean protein. Do not eat a lot of foods that are high in solid fats, added sugars, or sodium. Maintain a healthy weight Body mass index (BMI) is a measurement that can be used to identify possible weight problems. It estimates body fat based on height and weight. Your health care provider can help determine your BMI and help you achieve or maintain a healthy weight. Get regular exercise Get regular exercise. This is one of the most important things you can do for your health. Most adults should: Exercise for at least 150 minutes each week. The exercise should increase your heart rate and make you sweat (moderate-intensity exercise). Do strengthening exercises at least twice a week. This is in addition to the moderate-intensity exercise. Spend less time sitting. Even light physical activity can be beneficial. Watch cholesterol and blood lipids Have your blood tested for lipids and cholesterol at 49 years of age, then have this test every 5 years. You may need to have your cholesterol levels checked more often if: Your lipid or cholesterol levels are high. You are older than 49 years of age. You are at high risk for heart disease. What should I know about cancer screening? Many types of cancers can be detected early and may often be prevented. Depending on your health history and family history, you may need to have cancer screening at various ages. This may include screening for: Colorectal cancer. Prostate cancer. Skin cancer. Lung  cancer. What should I know about heart disease, diabetes, and high blood pressure? Blood pressure and heart disease High blood pressure causes heart disease and increases the risk of stroke. This is more likely to develop in people who have high blood pressure readings or are overweight. Talk with your health care provider about your target blood pressure readings. Have your blood pressure checked: Every 3-5 years if you are 18-39 years of age. Every year if you are 40 years old or older. If you are between the ages of 65 and 75 and are a current or former smoker, ask your health care provider if you should have a one-time screening for abdominal aortic aneurysm (AAA). Diabetes Have regular diabetes screenings. This checks your fasting blood sugar level. Have the screening done: Once every three years after age 45 if you are at a normal weight and have a low risk for diabetes. More often and at a younger age if you are overweight or have a high risk for diabetes. What should I know about preventing infection? Hepatitis B If you have a higher risk for hepatitis B, you should be screened for this virus. Talk with your health care provider to find out if you are at risk for hepatitis B infection. Hepatitis C Blood testing is recommended for: Everyone born from 1945 through 1965. Anyone with known risk factors for hepatitis C. Sexually transmitted infections (STIs) You should be screened each year for STIs, including gonorrhea and chlamydia, if: You are sexually active and are younger than 49 years of age. You are older than 49 years of age and your   health care provider tells you that you are at risk for this type of infection. Your sexual activity has changed since you were last screened, and you are at increased risk for chlamydia or gonorrhea. Ask your health care provider if you are at risk. Ask your health care provider about whether you are at high risk for HIV. Your health care provider  may recommend a prescription medicine to help prevent HIV infection. If you choose to take medicine to prevent HIV, you should first get tested for HIV. You should then be tested every 3 months for as long as you are taking the medicine. Follow these instructions at home: Alcohol use Do not drink alcohol if your health care provider tells you not to drink. If you drink alcohol: Limit how much you have to 0-2 drinks a day. Know how much alcohol is in your drink. In the [place] one drink equals one 12 oz bottle of beer (355 mL), one 5 oz glass of wine (148 mL), or one 1 oz glass of hard liquor (44 mL). Lifestyle Do not use any products that contain nicotine or tobacco. These products include cigarettes, chewing tobacco, and vaping devices, such as e-cigarettes. If you need help quitting, ask your health care provider. Do not use street drugs. Do not share needles. Ask your health care provider for help if you need support or information about quitting drugs. General instructions Schedule regular health, dental, and eye exams. Stay current with your vaccines. Tell your health care provider if: You often feel depressed. You have ever been abused or do not feel safe at home. Summary Adopting a healthy lifestyle and getting preventive care are important in promoting health and wellness. Follow your health care provider's instructions about healthy diet, exercising, and getting tested or screened for diseases. Follow your health care provider's instructions on monitoring your cholesterol and blood pressure. This information is not intended to replace advice given to you by your health care provider. Make sure you discuss any questions you have with your health care provider. Document Revised: 08/06/2020 Document Reviewed: 08/06/2020 Elsevier Patient Education  2023 [employer]  

## 2022-01-07 ENCOUNTER — Other Ambulatory Visit: Payer: BC Managed Care – PPO

## 2022-01-14 ENCOUNTER — Ambulatory Visit
Admission: RE | Admit: 2022-01-14 | Discharge: 2022-01-14 | Disposition: A | Discharge status: Home / Self Care | Payer: BC Managed Care – PPO | Source: Ambulatory Visit | Attending: Surgery | Admitting: Surgery

## 2022-01-14 ENCOUNTER — Other Ambulatory Visit: Payer: Self-pay | Admitting: Surgery

## 2022-01-14 DIAGNOSIS — Z08 Encounter for follow-up examination after completed treatment for malignant neoplasm: Secondary | ICD-10-CM

## 2022-01-14 DIAGNOSIS — Z85048 Personal history of other malignant neoplasm of rectum, rectosigmoid junction, and anus: Secondary | ICD-10-CM | POA: Diagnosis not present

## 2022-01-14 DIAGNOSIS — C20 Malignant neoplasm of rectum: Secondary | ICD-10-CM

## 2022-02-04 ENCOUNTER — Ambulatory Visit
Admission: RE | Admit: 2022-02-04 | Discharge: 2022-02-04 | Disposition: A | Discharge status: Home / Self Care | Payer: BC Managed Care – PPO | Source: Ambulatory Visit | Attending: Surgery | Admitting: Surgery

## 2022-02-04 DIAGNOSIS — Z85048 Personal history of other malignant neoplasm of rectum, rectosigmoid junction, and anus: Secondary | ICD-10-CM | POA: Diagnosis not present

## 2022-02-04 DIAGNOSIS — Z08 Encounter for follow-up examination after completed treatment for malignant neoplasm: Secondary | ICD-10-CM

## 2022-02-04 DIAGNOSIS — Z9079 Acquired absence of other genital organ(s): Secondary | ICD-10-CM | POA: Diagnosis not present

## 2022-02-04 DIAGNOSIS — C20 Malignant neoplasm of rectum: Secondary | ICD-10-CM

## 2022-02-04 DIAGNOSIS — R918 Other nonspecific abnormal finding of lung field: Secondary | ICD-10-CM | POA: Diagnosis not present

## 2022-02-04 DIAGNOSIS — Z8547 Personal history of malignant neoplasm of testis: Secondary | ICD-10-CM | POA: Diagnosis not present

## 2022-02-04 DIAGNOSIS — K7689 Other specified diseases of liver: Secondary | ICD-10-CM | POA: Diagnosis not present

## 2022-02-04 MED ORDER — IOPAMIDOL (ISOVUE-300) INJECTION 61%
100.0000 mL | Freq: Once | INTRAVENOUS | Status: AC | PRN
  Administered 2022-02-04: 100 mL via INTRAVENOUS

## 2022-03-05 DIAGNOSIS — C20 Malignant neoplasm of rectum: Secondary | ICD-10-CM | POA: Diagnosis not present

## 2022-11-04 ENCOUNTER — Encounter: Payer: Self-pay | Admitting: Family Medicine

## 2022-11-04 ENCOUNTER — Ambulatory Visit (INDEPENDENT_AMBULATORY_CARE_PROVIDER_SITE_OTHER): Payer: No Typology Code available for payment source | Admitting: Family Medicine

## 2022-11-04 VITALS — BP 132/83 | HR 47 | Ht 71.0 in | Wt 189.2 lb

## 2022-11-04 DIAGNOSIS — Z0000 Encounter for general adult medical examination without abnormal findings: Secondary | ICD-10-CM

## 2022-11-04 DIAGNOSIS — Z125 Encounter for screening for malignant neoplasm of prostate: Secondary | ICD-10-CM

## 2022-11-04 NOTE — Progress Notes (Signed)
Office Note 11/04/2022  CC:  Chief Complaint  Patient presents with   Annual Exam    Pt is fasting.     HPI:  Patient is a 52 y.o. male who is here for annual health maintenance exam.  Jay Ward feels well. He runs about 40 miles per week on average.  Past Medical History:  Diagnosis Date   Allergy    seasonal   Basal cell carcinoma 10/12/2019   Cancer of multiple primary sites [facility])    genetics referral by gen surg 01/17/20   Complication of anesthesia    Can not have 100% oxygen due to previous chemo tx   Family history of basal cell carcinoma    Family history of colon cancer    Family history of kidney cancer    Family history of prostate cancer in father    Hay fever    History of colon polyps    multiple   Inguinal hernia    Right   Melanoma (HCC) 2014   on his back.  Also R thigh with dysplastic nevus with moderate atypia 02/2016.  He sees Derm (Dr. Terri Piedra) q 6 mo: as of 08/2017 derm f/u all good.   Rectal adenocarcinoma (HCC) 01/2020   surgery 02/09/20, no adjuvant therapy.   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    Past Surgical History:  Procedure Laterality Date   APPENDECTOMY  1990   COLONOSCOPY  12/2019   Most recent 06/06/21 multiple polyps, recall 2-3 yrs   FLEXIBLE SIGMOIDOSCOPY  09/12/2020   3 polyps; recall 02/2021 for full colonoscopy d/t high burden of polyps on colonoscopy 12/2019.   INGUINAL HERNIA REPAIR Right    MELANOMA EXCISION     with lymph nodes   MOHS SURGERY     ORCHIECTOMY Left 2003   RECTAL EXAM UNDER ANESTHESIA N/A 02/09/2020   Procedure: RECTAL EXAM UNDER ANESTHESIA;  Surgeon: Andria Meuse, MD;  Location: [facility];  Service: General;  Laterality: N/A;   SURGERY SCROTAL / TESTICULAR     had hernia repair at the same   TRANSANAL EXCISION OF RECTAL MASS N/A 02/09/2020   Procedure: anorectal exam under anesthesia  excision left anorectal  polpoid lesion;  Surgeon: Andria Meuse, MD;  Location: [facility];  Service: General;  Laterality: N/A;    Family History  Problem Relation Age of Onset   Basal cell carcinoma Mother        several   Prostate cancer Father        dx 60s   Hyperlipidemia Father    Hypertension Father    Colon polyps Father 31       unknown number   Kidney cancer Father        dx 88s   Colon cancer Paternal Aunt        dx in early 70's   Heart disease Maternal Grandfather    Stroke Maternal Grandfather    Colon cancer Paternal Grandmother        Dx in her 54's   Colon polyps Brother        <10   Alzheimer's disease Paternal Grandfather    Esophageal cancer Neg Hx    Rectal cancer Neg Hx    Stomach cancer Neg Hx     Social History   Socioeconomic History   Marital status: Married    Spouse name: Not on file   Number  of children: Not on file   Years of education: Not on file   Highest education level: Bachelor's degree (e.g., BA, AB, BS)  Occupational History   Not on file  Tobacco Use   Smoking status: Former    Current packs/day: 0.00    Average packs/day: 0.3 packs/day for 10.0 years (2.5 ttl pk-yrs)    Types: Cigarettes    Start date: 04/01/1991    Quit date: 03/31/2001    Years since quitting: 21.6    Passive exposure: Never   Smokeless tobacco: Never  Vaping Use   Vaping status: Never Used  Substance and Sexual Activity   Alcohol use: Yes    Alcohol/week: 6.0 standard drinks of alcohol    Types: 6 Cans of beer per week    Comment: 2 x a week   Drug use: No   Sexual activity: Not on file  Other Topics Concern   Not on file  Social History Narrative   Married, no children.   Orig from [place].   Educ: BS   Occup: Banking   Minimal tob use in the past--quit 2003.   Alc: rare.    Social Determinants of Health   Financial Resource Strain: Low Risk  (09/27/2021)   Overall Financial Resource Strain (CARDIA)    Difficulty of Paying Living Expenses: Not hard at all   Food Insecurity: No Food Insecurity (09/27/2021)   Hunger Vital Sign    Worried About Running Out of Food in the Last Year: Never true    Ran Out of Food in the Last Year: Never true  Transportation Needs: No Transportation Needs (09/27/2021)   PRAPARE - Administrator, Civil Service (Medical): No    Lack of Transportation (Non-Medical): No  Physical Activity: Sufficiently Active (09/27/2021)   Exercise Vital Sign    Days of Exercise per Week: 7 days    Minutes of Exercise per Session: 40 min  Stress: No Stress Concern Present (09/27/2021)   [employer] of Occupational Health - Occupational Stress Questionnaire    Feeling of Stress : Only a little  Social Connections: Unknown (09/27/2021)   Social Connection and Isolation Panel [NHANES]    Frequency of Communication with Friends and Family: Patient declined    Frequency of Social Gatherings with Friends and Family: Three times a week    Attends Religious Services: Patient declined    Active Member of Clubs or Organizations: Patient declined    Attends Banker Meetings: Not on file    Marital Status: Married  Catering manager Violence: Not on file    Outpatient Medications Prior to Visit  Medication Sig Dispense Refill   psyllium (METAMUCIL) 58.6 % packet Take 1 packet by mouth daily.     EPINEPHrine (EPIPEN 2-PAK) 0.3 mg/0.3 mL IJ SOAJ injection Inject 0.3 mg into the muscle as needed for anaphylaxis. (Patient not taking: Reported on 11/04/2022) 2 each 1   No facility-administered medications prior to visit.    No Known Allergies  Review of Systems  Constitutional:  Negative for appetite change, chills, fatigue and fever.  HENT:  Negative for congestion, dental problem, ear pain and sore throat.   Eyes:  Negative for discharge, redness and visual disturbance.  Respiratory:  Negative for cough, chest tightness, shortness of breath and wheezing.   Cardiovascular:  Negative for chest pain, palpitations  and leg swelling.  Gastrointestinal:  Negative for abdominal pain, blood in stool, diarrhea, nausea and vomiting.  Genitourinary:  Negative for difficulty urinating,  dysuria, flank pain, frequency, hematuria and urgency.  Musculoskeletal:  Negative for arthralgias, back pain, joint swelling, myalgias and neck stiffness.  Skin:  Negative for pallor and rash.  Neurological:  Negative for dizziness, speech difficulty, weakness and headaches.  Hematological:  Negative for adenopathy. Does not bruise/bleed easily.  Psychiatric/Behavioral:  Negative for confusion and sleep disturbance. The patient is not nervous/anxious.     PE;    11/04/2022    8:14 AM 10/30/2021    8:57 AM 10/07/2021    4:13 PM  Vitals with BMI  Height 5\' 11"  5\' 11"    Weight 189 lbs 3 oz 190 lbs 3 oz   BMI 26.4 26.54   Systolic 132 123 191  Diastolic 83 76 76  Pulse 47 45     Gen: Alert, well appearing.  Patient is oriented to person, place, time, and situation. AFFECT: pleasant, lucid thought and speech. ENT: Ears: EACs clear, normal epithelium.  TMs with good light reflex and landmarks bilaterally.  Eyes: no injection, icteris, swelling, or exudate.  EOMI, PERRLA. Nose: no drainage or turbinate edema/swelling.  No injection or focal lesion.  Mouth: lips without lesion/swelling.  Oral mucosa pink and moist.  Dentition intact and without obvious caries or gingival swelling.  Oropharynx without erythema, exudate, or swelling.  Neck: supple/nontender.  No LAD, mass, or TM.  Carotid pulses 2+ bilaterally, without bruits. CV: Regular rhythm, bradycardic to the 40s., no m/r/g.   LUNGS: CTA bilat, nonlabored resps, good aeration in all lung fields. ABD: soft, NT, ND, BS normal.  No hepatospenomegaly or mass.  No bruits. EXT: no clubbing, cyanosis, or edema.  Musculoskeletal: no joint swelling, erythema, warmth, or tenderness.  ROM of all joints intact. Skin - no sores or suspicious lesions or rashes or color changes  Pertinent  labs:  Lab Results  Component Value Date   TSH 1.16 10/30/2021   Lab Results  Component Value Date   WBC 4.5 10/30/2021   HGB 14.1 10/30/2021   HCT 42.4 10/30/2021   MCV 86.8 10/30/2021   PLT 228.0 10/30/2021   Lab Results  Component Value Date   CREATININE 1.04 10/30/2021   BUN 14 10/30/2021   NA 141 10/30/2021   K 4.4 10/30/2021   CL 105 10/30/2021   CO2 27 10/30/2021   Lab Results  Component Value Date   ALT 20 10/30/2021   AST 22 10/30/2021   ALKPHOS 58 10/30/2021   BILITOT 0.6 10/30/2021   Lab Results  Component Value Date   CHOL 226 (H) 10/30/2021   Lab Results  Component Value Date   HDL 76.20 10/30/2021   Lab Results  Component Value Date   LDLCALC 139 (H) 10/30/2021   Lab Results  Component Value Date   TRIG 53.0 10/30/2021   Lab Results  Component Value Date   CHOLHDL 3 10/30/2021   Lab Results  Component Value Date   PSA 1.03 10/30/2021   PSA 1.01 10/26/2020   PSA 0.98 10/26/2019   ASSESSMENT AND PLAN:   Health maintenance exam: Reviewed age and gender appropriate health maintenance issues (prudent diet, regular exercise, health risks of tobacco and excessive alcohol, use of seatbelts, fire alarms in home, use of sunscreen).  Also reviewed age and gender appropriate health screening as well as vaccine recommendations. Vaccines: Shingrix--> deferred but will return for this.  Otherwise up-to-date. Labs: fasting HP + PSA. Prostate ca screening: PSA today. Colon ca screening: pt followed closely by GI and general surgery for his recent hx  of rectal adenocarcinoma back in 2021.  Most recent colonoscopy was 06/06/2021 and this showed 4 polyps that were adenomatous, recall in 2 to 3 years. Skin ca screening: he gets quite frequent derm eval's d/t hx of melanoma and BCC.  An After Visit Summary was printed and given to the patient.  FOLLOW UP:  Return in about 1 year (around 11/04/2023) for annual CPE (fasting).  Signed:  Santiago Bumpers, MD            11/04/2022

## 2023-08-12 ENCOUNTER — Other Ambulatory Visit: Payer: Self-pay | Admitting: Surgery

## 2023-08-12 DIAGNOSIS — C20 Malignant neoplasm of rectum: Secondary | ICD-10-CM

## 2023-08-13 ENCOUNTER — Other Ambulatory Visit: Payer: Self-pay | Admitting: Surgery

## 2023-08-13 DIAGNOSIS — C20 Malignant neoplasm of rectum: Secondary | ICD-10-CM

## 2023-08-20 ENCOUNTER — Ambulatory Visit
Admission: RE | Admit: 2023-08-20 | Discharge: 2023-08-20 | Disposition: A | Discharge status: Home / Self Care | Source: Ambulatory Visit | Attending: Surgery | Admitting: Surgery

## 2023-08-20 DIAGNOSIS — C20 Malignant neoplasm of rectum: Secondary | ICD-10-CM

## 2023-08-20 MED ORDER — IOPAMIDOL (ISOVUE-300) INJECTION 61%
100.0000 mL | Freq: Once | INTRAVENOUS | Status: AC | PRN
  Administered 2023-08-20: 100 mL via INTRAVENOUS

## 2023-08-26 ENCOUNTER — Ambulatory Visit
Admission: RE | Admit: 2023-08-26 | Discharge: 2023-08-26 | Disposition: A | Discharge status: Home / Self Care | Source: Ambulatory Visit | Attending: Surgery

## 2023-08-26 DIAGNOSIS — C20 Malignant neoplasm of rectum: Secondary | ICD-10-CM

## 2023-09-09 ENCOUNTER — Telehealth: Payer: Self-pay | Admitting: *Deleted

## 2023-09-09 NOTE — Telephone Encounter (Signed)
-----   Message from Ace Holder sent at 09/07/2023  5:25 PM EDT ----- Regarding: FW: Follow-up, mutual patient Can someone help schedule this patient for a routine colonoscopy with me? Thanks ----- Message ----- From: Melvenia Stabs, MD Sent: 09/07/2023  11:29 AM EDT To: Ace Holder, MD Subject: Follow-up, mutual patient                      Leobardo Rainier --  I believe he's coming up on being due for surveillance colonoscopy. I saw him back today and he's doing great.  Larinda Plover

## 2023-09-09 NOTE — Telephone Encounter (Signed)
 Patient has been scheduled for previsit on 11/02/23 and colonoscopy on 11/17/23.

## 2023-11-02 ENCOUNTER — Other Ambulatory Visit: Payer: Self-pay

## 2023-11-02 ENCOUNTER — Ambulatory Visit (AMBULATORY_SURGERY_CENTER)

## 2023-11-02 VITALS — Ht 70.0 in | Wt 190.0 lb

## 2023-11-02 DIAGNOSIS — C20 Malignant neoplasm of rectum: Secondary | ICD-10-CM

## 2023-11-02 MED ORDER — NA SULFATE-K SULFATE-MG SULF 17.5-3.13-1.6 GM/177ML PO SOLN: 1.0000 | Freq: Once | ORAL | 0 refills | Status: AC

## 2023-11-02 NOTE — Progress Notes (Signed)
 Denies allergies to eggs or soy products. Denies complication of anesthesia or sedation. Denies use of weight loss medication. Denies use of O2.   Emmi instructions given for colonoscopy.

## 2023-11-03 ENCOUNTER — Encounter: Payer: Self-pay | Admitting: Gastroenterology

## 2023-11-10 ENCOUNTER — Encounter: Payer: No Typology Code available for payment source | Admitting: Family Medicine

## 2023-11-17 ENCOUNTER — Encounter: Payer: Self-pay | Admitting: Gastroenterology

## 2023-11-17 ENCOUNTER — Encounter: Payer: No Typology Code available for payment source | Admitting: Family Medicine

## 2023-11-17 ENCOUNTER — Ambulatory Visit (AMBULATORY_SURGERY_CENTER): Admitting: Gastroenterology

## 2023-11-17 VITALS — BP 105/66 | HR 40 | Temp 97.9°F | Resp 20 | Ht 71.0 in | Wt 190.0 lb

## 2023-11-17 DIAGNOSIS — K6389 Other specified diseases of intestine: Secondary | ICD-10-CM | POA: Diagnosis not present

## 2023-11-17 DIAGNOSIS — D125 Benign neoplasm of sigmoid colon: Secondary | ICD-10-CM | POA: Diagnosis not present

## 2023-11-17 DIAGNOSIS — K648 Other hemorrhoids: Secondary | ICD-10-CM

## 2023-11-17 DIAGNOSIS — D123 Benign neoplasm of transverse colon: Secondary | ICD-10-CM

## 2023-11-17 DIAGNOSIS — Z85048 Personal history of other malignant neoplasm of rectum, rectosigmoid junction, and anus: Secondary | ICD-10-CM

## 2023-11-17 DIAGNOSIS — Z860101 Personal history of adenomatous and serrated colon polyps: Secondary | ICD-10-CM | POA: Diagnosis not present

## 2023-11-17 DIAGNOSIS — Z1211 Encounter for screening for malignant neoplasm of colon: Secondary | ICD-10-CM

## 2023-11-17 DIAGNOSIS — K5730 Diverticulosis of large intestine without perforation or abscess without bleeding: Secondary | ICD-10-CM | POA: Diagnosis not present

## 2023-11-17 DIAGNOSIS — D120 Benign neoplasm of cecum: Secondary | ICD-10-CM

## 2023-11-17 MED ORDER — SODIUM CHLORIDE 0.9 % IV SOLN: 500.0000 mL | INTRAVENOUS | Status: DC

## 2023-11-17 NOTE — Progress Notes (Signed)
 Called to room to assist during endoscopic procedure.  Patient ID and intended procedure confirmed with present staff. Received instructions for my participation in the procedure from the performing physician.

## 2023-11-17 NOTE — Progress Notes (Signed)
 Pt's states no medical or surgical changes since previsit or office visit.

## 2023-11-17 NOTE — Patient Instructions (Signed)
 Resume previous diet and medications. Awaiting pathology results. Repeat Colonoscopy date to be determined based on pathology results. Handouts provided on Colon polyps, Diverticulosis and Hemorrhoids   YOU HAD AN ENDOSCOPIC PROCEDURE TODAY AT THE [facility]:   Refer to the procedure report that was given to you for any specific questions about what was found during the examination.  If the procedure report does not answer your questions, please call your gastroenterologist to clarify.  If you requested that your care partner not be given the details of your procedure findings, then the procedure report has been included in a sealed envelope for you to review at your convenience later.  YOU SHOULD EXPECT: Some feelings of bloating in the abdomen. Passage of more gas than usual.  Walking can help get rid of the air that was put into your GI tract during the procedure and reduce the bloating. If you had a lower endoscopy (such as a colonoscopy or flexible sigmoidoscopy) you may notice spotting of blood in your stool or on the toilet paper. If you underwent a bowel prep for your procedure, you may not have a normal bowel movement for a few days.  Please Note:  You might notice some irritation and congestion in your nose or some drainage.  This is from the oxygen used during your procedure.  There is no need for concern and it should clear up in a day or so.  SYMPTOMS TO REPORT IMMEDIATELY:  Following lower endoscopy (colonoscopy or flexible sigmoidoscopy):  Excessive amounts of blood in the stool  Significant tenderness or worsening of abdominal pains  Swelling of the abdomen that is new, acute  Fever of 100F or higher  For urgent or emergent issues, a gastroenterologist can be reached at any hour by calling [phone]. Do not use MyChart messaging for urgent concerns.    DIET:  We do recommend a small meal at first, but then you may proceed to your regular diet.  Drink plenty of  fluids but you should avoid alcoholic beverages for 24 hours.  ACTIVITY:  You should plan to take it easy for the rest of today and you should NOT DRIVE or use heavy machinery until tomorrow (because of the sedation medicines used during the test).    FOLLOW UP: Our staff will call the number listed on your records the next business day following your procedure.  We will call around 7:15- 8:00 am to check on you and address any questions or concerns that you may have regarding the information given to you following your procedure. If we do not reach you, we will leave a message.     If any biopsies were taken you will be contacted by phone or by letter within the next 1-3 weeks.  Please call us at [phone] if you have not heard about the biopsies in 3 weeks.    SIGNATURES/CONFIDENTIALITY: You and/or your care partner have signed paperwork which will be entered into your electronic medical record.  These signatures attest to the fact that that the information above on your After Visit Summary has been reviewed and is understood.  Full responsibility of the confidentiality of this discharge information lies with you and/or your care-partner.

## 2023-11-17 NOTE — Op Note (Signed)
 [facility] Patient Name: Jay Ward Procedure Date: 11/17/2023 8:33 AM MRN: [id number] Endoscopist: Elspeth P. Leigh , MD, [id number] Age: 53 Referring MD:  Date of Birth: March 24, 1971 Gender: Male Account #: [id number] Procedure:                Colonoscopy Indications:              High risk colon cancer surveillance: Personal                            history of colon cancer - 10/21 - 13 adenomas and                            rectal cancer, flex sig 6/22 showed no recurrent                            malignancy. 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 Last exam 2023, few small polyps Medicines:                Monitored Anesthesia Care Procedure:                Pre-Anesthesia Assessment:                           - Prior to the procedure, a History and Physical                            was performed, and patient medications and                            allergies were reviewed. The patient's tolerance of                            previous anesthesia was also reviewed. The risks                            and benefits of the procedure and the sedation                            options and risks were discussed with the patient.                            All questions were answered, and informed consent                            was obtained. Prior Anticoagulants: The patient has                            taken no anticoagulant or antiplatelet agents. ASA                            Grade Assessment: II - A patient with mild systemic  disease. After reviewing the risks and benefits,                            the patient was deemed in satisfactory condition to                            undergo the procedure.                           After obtaining informed consent, the colonoscope                            was passed under direct vision. Throughout the                            procedure,  the patient's blood pressure, pulse, and                            oxygen saturations were monitored continuously. The                            Olympus Scope SN: [id number] was introduced through                            the anus and advanced to the the cecum, identified                            by appendiceal orifice and ileocecal valve. The                            colonoscopy was performed without difficulty. The                            patient tolerated the procedure well. The quality                            of the bowel preparation was adequate. The                            ileocecal valve, appendiceal orifice, and rectum                            were photographed. Scope In: 8:43:03 AM Scope Out: 9:01:53 AM Scope Withdrawal Time: 0 hours 14 minutes 39 seconds  Total Procedure Duration: 0 hours 18 minutes 50 seconds  Findings:                 The perianal and digital rectal examinations were                            normal.                           Five flat polyps were found in the cecum. The  polyps were 1 to 3 mm in size. These polyps were                            removed with a cold snare. Resection and retrieval                            were complete.                           A diminutive polyp was found in the transverse                            colon. The polyp was sessile. The polyp was removed                            with a cold snare. Resection and retrieval were                            complete.                           Two sessile polyps were found in the sigmoid colon.                            The polyps were 3 mm in size. These polyps were                            removed with a cold snare. Resection and retrieval                            were complete.                           A few small-mouthed diverticula were found in the                            sigmoid colon.                           Internal  hemorrhoids were found during retroflexion.                           A large scar was found in the distal rectum from                            prior transanal excision.                           The exam was otherwise without abnormality. Complications:            No immediate complications. Estimated blood loss:                            Minimal. Estimated Blood Loss:     Estimated blood loss was minimal. Impression:               -  Five 1 to 3 mm polyps in the cecum, removed with                            a cold snare. Resected and retrieved.                           - One diminutive polyp in the transverse colon,                            removed with a cold snare. Resected and retrieved.                           - Two 3 mm polyps in the sigmoid colon, removed                            with a cold snare. Resected and retrieved.                           - Diverticulosis in the sigmoid colon.                           - Internal hemorrhoids.                           - Scar in the distal rectum from prior resection.                           - The examination was otherwise normal. Recommendation:           - Patient has a contact number available for                            emergencies. The signs and symptoms of potential                            delayed complications were discussed with the                            patient. Return to normal activities tomorrow.                            Written discharge instructions were provided to the                            patient.                           - Resume previous diet.                           - Continue present medications.                           - Await pathology results. Elspeth P. Sharika Mosquera, MD 11/17/2023 9:11:35 AM This report has been signed electronically.

## 2023-11-17 NOTE — Progress Notes (Signed)
 [facility] History and Physical   Primary Care Physician:  Candise Aleene DEL, MD   Reason for Procedure:   History of rectal cancer / history of colon polyps  Plan:    colonoscopy     HPI: Jay Ward is a 53 y.o. male  here for colonoscopy surveillance - history of rectal cancer s/p transanal excision in 2021, numerous polyps removed in the past. Last exam 05/2021.   Patient denies any bowel symptoms at this time.  Otherwise feels well without any cardiopulmonary symptoms.   I have discussed risks / benefits of anesthesia and endoscopic procedure with Jay Ward and they wish to proceed with the exams as outlined today.    Past Medical History:  Diagnosis Date   Allergy    seasonal   Basal cell carcinoma 10/12/2019   Cancer of multiple primary sites [facility])    genetics referral by gen surg 01/17/20   Complication of anesthesia    Can not have 100% oxygen due to previous chemo tx   Family history of basal cell carcinoma    Family history of colon cancer    Family history of kidney cancer    Family history of prostate cancer in father    Hay fever    History of colon polyps    multiple   Inguinal hernia    Right   Melanoma (HCC) 2014   on his back.  Also R thigh with dysplastic nevus with moderate atypia 02/2016.  He sees Derm (Dr. Ivin) q 6 mo: as of 08/2017 derm f/u all good.   Rectal adenocarcinoma (HCC) 01/2020   surgery 02/09/20, no adjuvant therapy.   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    Past Surgical History:  Procedure Laterality Date   APPENDECTOMY  1990   COLONOSCOPY  12/2019   Most recent 06/06/21 multiple polyps, recall 2-3 yrs   FLEXIBLE SIGMOIDOSCOPY  09/12/2020   3 polyps; recall 02/2021 for full colonoscopy d/t high burden of polyps on colonoscopy 12/2019.   INGUINAL HERNIA REPAIR Right    MELANOMA EXCISION     with lymph nodes    MOHS SURGERY     ORCHIECTOMY Left 2003   RECTAL EXAM UNDER ANESTHESIA N/A 02/09/2020   Procedure: RECTAL EXAM UNDER ANESTHESIA;  Surgeon: Teresa Lonni HERO, MD;  Location: [facility];  Service: General;  Laterality: N/A;   SURGERY SCROTAL / TESTICULAR     had hernia repair at the same   TRANSANAL EXCISION OF RECTAL MASS N/A 02/09/2020   Procedure: anorectal exam under anesthesia  excision left anorectal polpoid lesion;  Surgeon: Teresa Lonni HERO, MD;  Location: [facility];  Service: General;  Laterality: N/A;    Prior to Admission medications   Medication Sig Start Date End Date Taking? Authorizing Provider  hydrocortisone 2.5 % cream Apply 2.5 Applications topically daily as needed. 05/13/23  Yes [provider]  psyllium (METAMUCIL) 58.6 % packet Take 1 packet by mouth daily.   Yes [provider]  EPINEPHrine  (EPIPEN  2-PAK) 0.3 mg/0.3 mL IJ SOAJ injection Inject 0.3 mg into the muscle as needed for anaphylaxis. Patient not taking: Reported on 11/17/2023 09/27/21   McGowen, Philip H, MD    Current Outpatient Medications  Medication Sig Dispense Refill   hydrocortisone 2.5 % cream Apply 2.5 Applications topically daily as needed.     psyllium (METAMUCIL) 58.6 % packet Take 1 packet by mouth  daily.     EPINEPHrine  (EPIPEN  2-PAK) 0.3 mg/0.3 mL IJ SOAJ injection Inject 0.3 mg into the muscle as needed for anaphylaxis. (Patient not taking: Reported on 11/17/2023) 2 each 1   Current Facility-Administered Medications  Medication Dose Route Frequency Provider Last Rate Last Admin   0.9 %  sodium chloride  infusion  500 mL Intravenous Continuous Ambert Virrueta, Elspeth SQUIBB, MD        Allergies as of 11/17/2023   (No Known Allergies)    Family History  Problem Relation Age of Onset   Basal cell carcinoma Mother        several   Prostate cancer Father        dx 63s   Hyperlipidemia Father    Hypertension Father    Colon polyps Father 46       unknown number   Kidney cancer  Father        dx 29s   Colon cancer Paternal Aunt        dx in early 70's   Heart disease Maternal Grandfather    Stroke Maternal Grandfather    Colon cancer Paternal Grandmother        Dx in her 30's   Colon polyps Brother        <10   Alzheimer's disease Paternal Grandfather    Esophageal cancer Neg Hx    Rectal cancer Neg Hx    Stomach cancer Neg Hx     Social History   Socioeconomic History   Marital status: Married    Spouse name: Not on file   Number of children: Not on file   Years of education: Not on file   Highest education level: Bachelor's degree (e.g., BA, AB, BS)  Occupational History   Not on file  Tobacco Use   Smoking status: Former    Current packs/day: 0.00    Average packs/day: 0.3 packs/day for 10.0 years (2.5 ttl pk-yrs)    Types: Cigarettes    Start date: 04/01/1991    Quit date: 03/31/2001    Years since quitting: 22.6    Passive exposure: Never   Smokeless tobacco: Never  Vaping Use   Vaping status: Never Used  Substance and Sexual Activity   Alcohol use: Yes    Alcohol/week: 6.0 standard drinks of alcohol    Types: 6 Cans of beer per week    Comment: 2 x a week   Drug use: No   Sexual activity: Not on file  Other Topics Concern   Not on file  Social History Narrative   Married, no children.   Orig from [place] .   Educ: BS   Occup: Banking   Minimal tob use in the past--quit 2003.   Alc: rare.    Social Drivers of Corporate investment banker Strain: Low Risk  (09/27/2021)   Overall Financial Resource Strain (CARDIA)    Difficulty of Paying Living Expenses: Not hard at all  Food Insecurity: No Food Insecurity (09/27/2021)   Hunger Vital Sign    Worried About Running Out of Food in the Last Year: Never true    Ran Out of Food in the Last Year: Never true  Transportation Needs: No Transportation Needs (09/27/2021)   PRAPARE - Administrator, Civil Service (Medical): No    Lack of Transportation (Non-Medical): No  Physical  Activity: Sufficiently Active (09/27/2021)   Exercise Vital Sign    Days of Exercise per Week: 7 days    Minutes of Exercise per  Session: 40 min  Stress: No Stress Concern Present (09/27/2021)   [employer] of Occupational Health - Occupational Stress Questionnaire    Feeling of Stress : Only a little  Social Connections: Unknown (09/27/2021)   Social Connection and Isolation Panel    Frequency of Communication with Friends and Family: Patient declined    Frequency of Social Gatherings with Friends and Family: Three times a week    Attends Religious Services: Patient declined    Active Member of Clubs or Organizations: Patient declined    Attends Banker Meetings: Not on file    Marital Status: Married  Catering manager Violence: Not on file    Review of Systems: All other review of systems negative except as mentioned in the HPI.  Physical Exam: Vital signs BP (!) 141/92   Pulse (!) 47   Temp 97.9 F (36.6 C)   Ht 5' 11 (1.803 m)   Wt 190 lb (86.2 kg)   SpO2 100%   BMI 26.50 kg/m   General:   Alert,  Well-developed, pleasant and cooperative in NAD Lungs:  Clear throughout to auscultation.   Heart:  Regular rate and rhythm Abdomen:  Soft, nontender and nondistended.   Neuro/Psych:  Alert and cooperative. Normal mood and affect. A and O x 3  Marcey Naval, MD [facility]

## 2023-11-17 NOTE — Progress Notes (Signed)
 A/o x 3, VSS, gd SR's, pleased with anesthesia, report to RN

## 2023-11-18 ENCOUNTER — Telehealth: Payer: Self-pay | Admitting: *Deleted

## 2023-11-18 NOTE — Telephone Encounter (Signed)
  Follow up Call-     11/17/2023    7:41 AM 06/06/2021    8:28 AM  Call back number  Post procedure Call Back phone  # [phone]  Permission to leave phone message Yes Yes   Left message to call back if any questions or concerns

## 2023-11-19 LAB — SURGICAL PATHOLOGY

## 2023-11-22 ENCOUNTER — Ambulatory Visit: Payer: Self-pay | Admitting: Gastroenterology

## 2023-11-22 DIAGNOSIS — Z1501 Genetic susceptibility to malignant neoplasm of breast: Secondary | ICD-10-CM

## 2023-12-02 IMAGING — MR MR PELVIS W/O CM
4 of 7 series · 19 of 48 positions shown · non-contrast
Comparison: 09/14/2020 abdominopelvic CT. Pelvic MRIs including
07/03/2020.

CLINICAL DATA: Rectal cancer diagnosed last year with surgery.
Asymptomatic. Testicular cancer and melanoma.

EXAM:
MRI PELVIS WITHOUT CONTRAST
TECHNIQUE: Multiplanar multisequence MR imaging of the pelvis was performed. No
intravenous contrast was administered. Ultrasound gel was
administered per rectum to optimize tumor evaluation.

[Series 2: T2 · sagittal · 3.0mm · 0.75mm/px · 6 of 36 slices shown (1 of 3)]
[im 1/36]
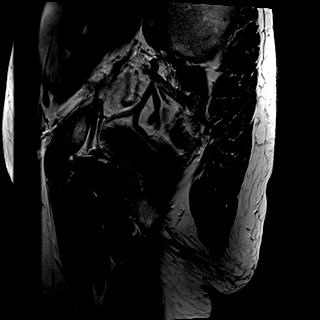
[im 8/36]
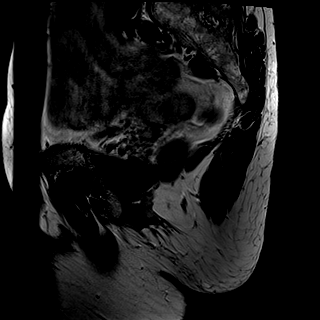
[im 15/36]
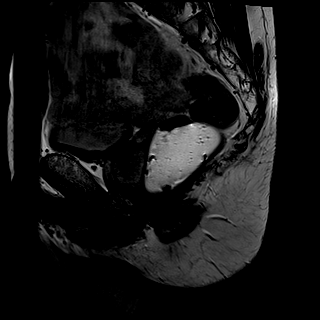
[im 22/36]
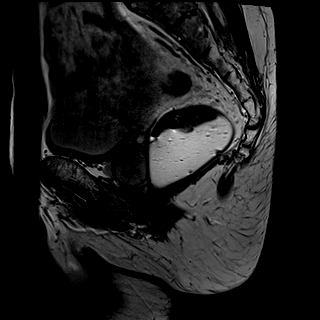
[im 29/36]
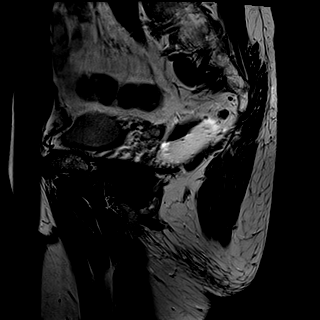
[im 36/36]
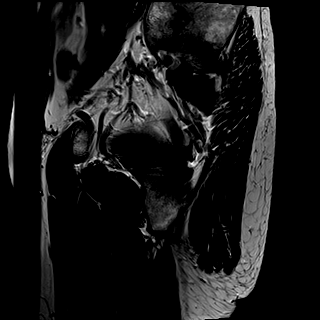

[Series 3: T2 · axial · 5.0mm · 0.59mm/px · z∈[-131,+73]mm · 5 of 35 slices shown (2 of 3)]
[im 1/35]
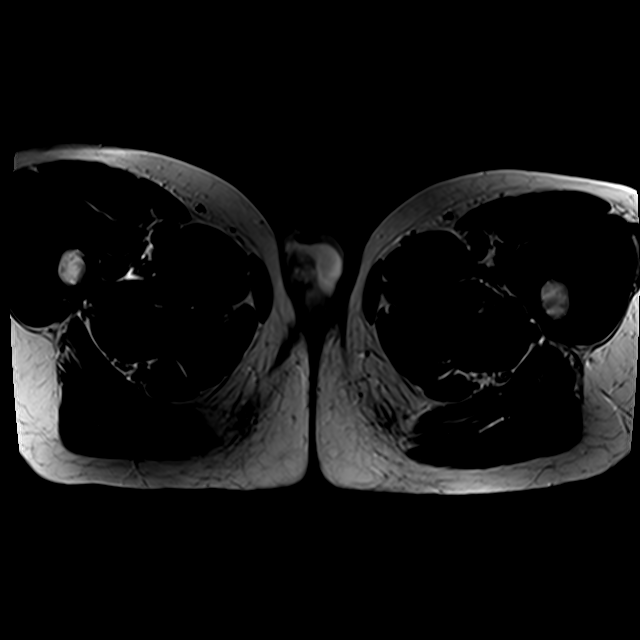
[im 9/35]
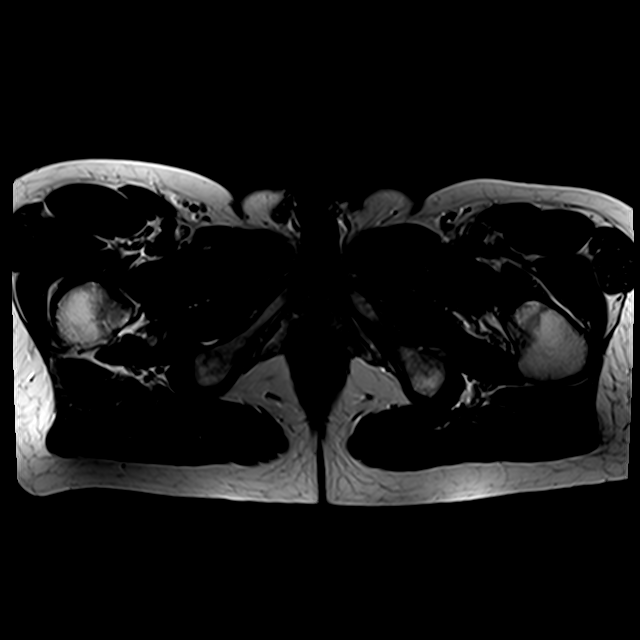
[im 18/35]
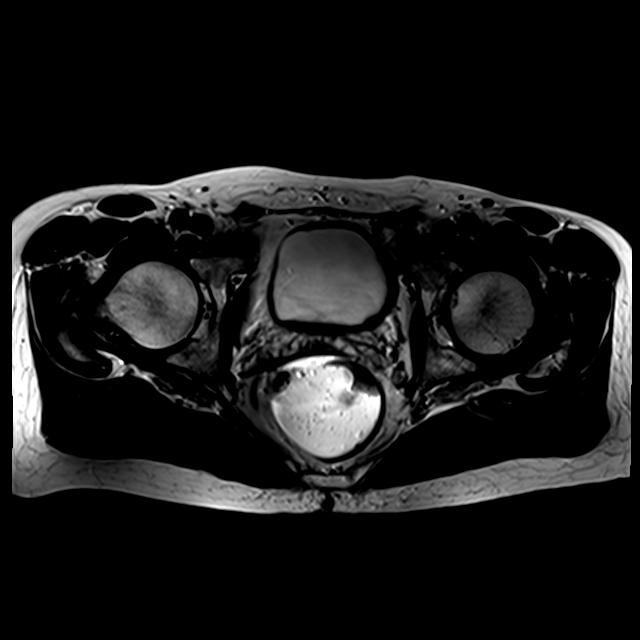
[im 26/35]
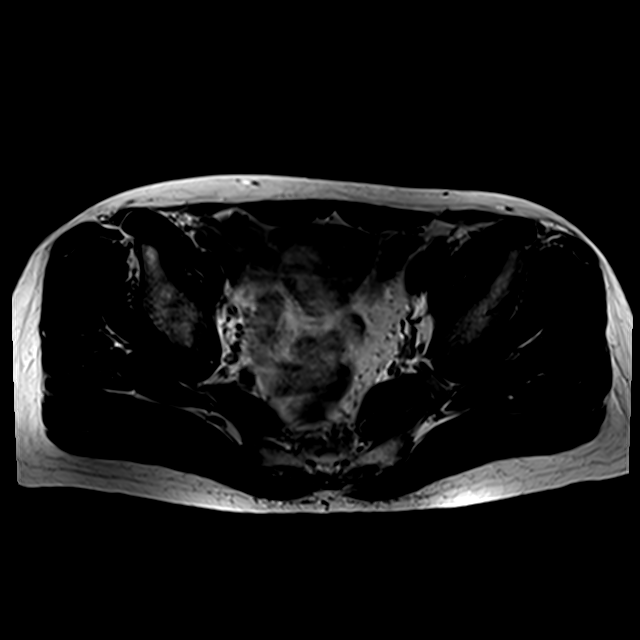
[im 35/35]
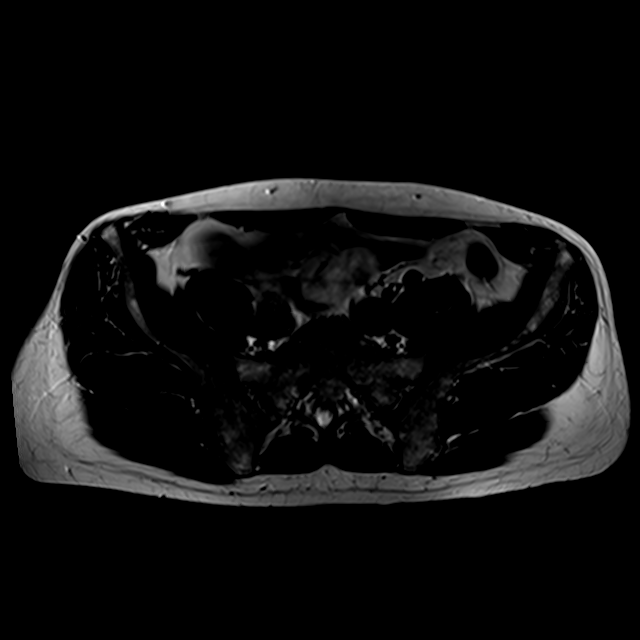

[Series 4: T2 · coronal · 3.0mm · 0.38mm/px · 5 of 42 slices shown (3 of 3)]
[im 1/42]
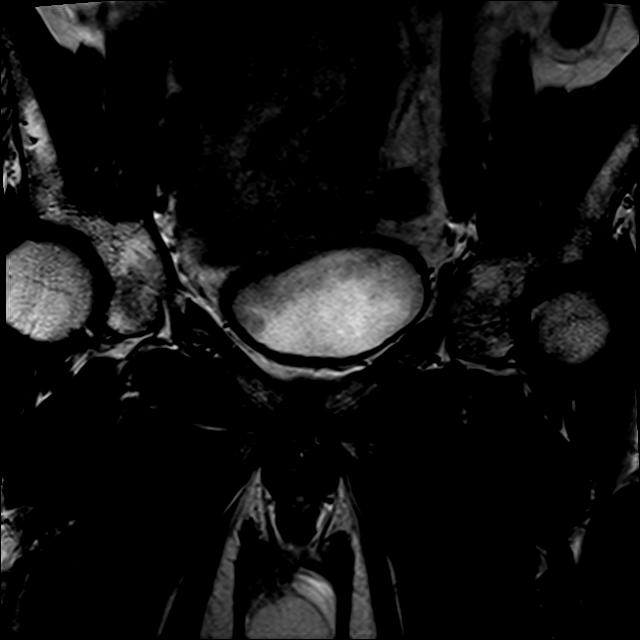
[im 9/42]
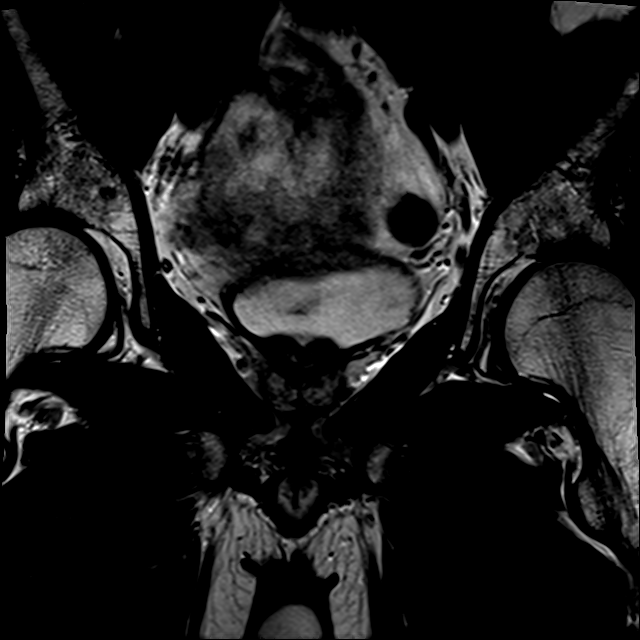
[im 17/42]
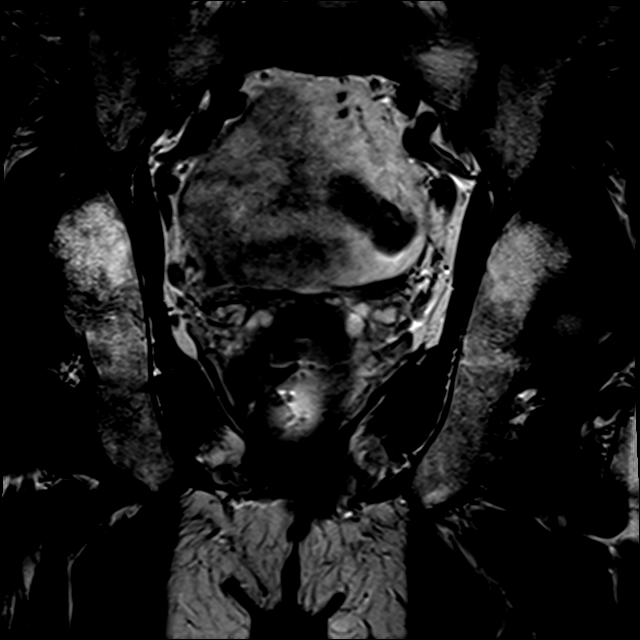
[im 25/42]
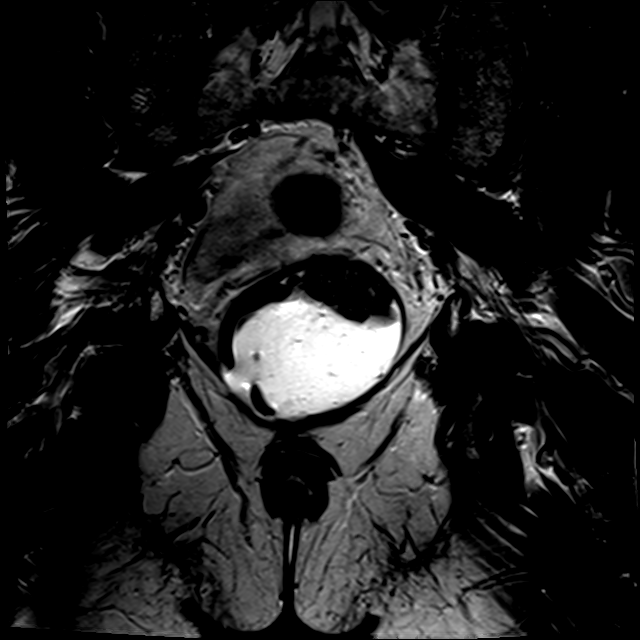
[im 42/42]
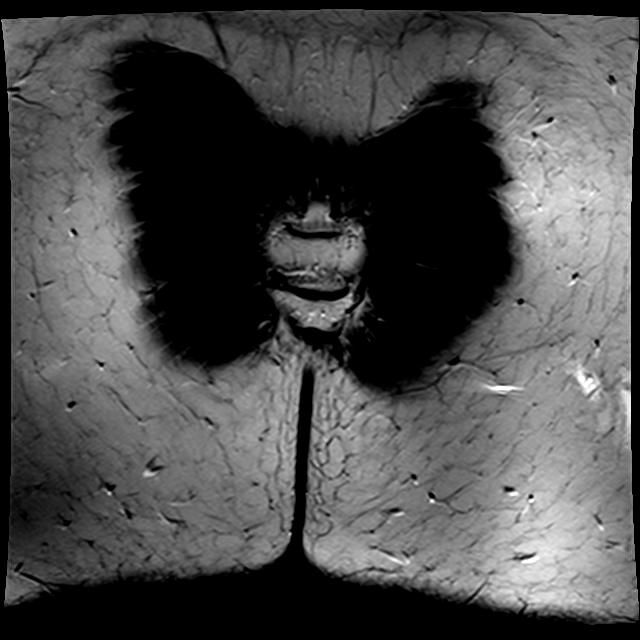

[Series 5: DWI · axial · 5.0mm · 1.42mm/px · z∈[-135,+21]mm · 3 of 108 slices shown]
[im 15/108]
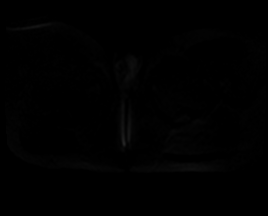
[im 58/108]
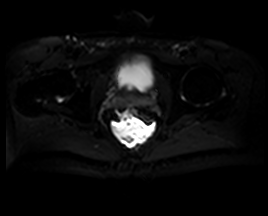
[im 93/108]
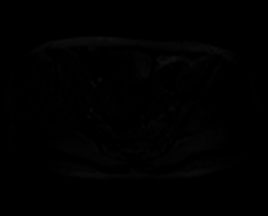

[19 of 48 positions shown; findings below may reference images not displayed]

FINDINGS: TUMOR LOCATION

No well-defined tumor is identified. At the site of primary back on
01/14/2020, along the posterior low left rectal wall, is 1.6 cm soft
tissue signal including on [DATE]. This is less apparent on other
pulse sequences, and may simply represent stool or admixture of
contrast and fluid/gas.

T - CATEGORY

Extension through Muscularis Propria: No=T2

Shortest Distance of any tumor/node from Mesorectal Fascia: 2-3 mm
from the perirectal node described below.

Extramural Vascular Invasion/Tumor Thrombus: No

Invasion of Anterior Peritoneal Reflection: No

Involvement of Adjacent Organs or Pelvic Sidewall: No

Levator Ani Involvement: No

N - CATEGORY

Mesorectal Lymph Nodes >=5mm: A single left perirectal node of 5 mm
including on [DATE] is similar back to 01/14/2020.

Extra-mesorectal Lymphadenopathy: No

Other: No significant free fluid. Normal prostate. Normal urinary
bladder.
IMPRESSION: At the site of primary back on 01/14/2020, along the posterior low
left rectal wall, is soft tissue signal which could be artifactual
or less likely represent recurrence. Correlate with sigmoidoscopy,
if not recently performed.

Ongoing stability of a 5 mm left perirectal node, favoring a benign
etiology.

## 2023-12-03 ENCOUNTER — Ambulatory Visit: Admitting: Family Medicine

## 2023-12-03 ENCOUNTER — Encounter: Payer: Self-pay | Admitting: Family Medicine

## 2023-12-03 VITALS — BP 118/72 | HR 53 | Temp 97.2°F | Ht 71.0 in | Wt 196.8 lb

## 2023-12-03 DIAGNOSIS — Z23 Encounter for immunization: Secondary | ICD-10-CM | POA: Diagnosis not present

## 2023-12-03 DIAGNOSIS — Z125 Encounter for screening for malignant neoplasm of prostate: Secondary | ICD-10-CM | POA: Diagnosis not present

## 2023-12-03 DIAGNOSIS — Z0000 Encounter for general adult medical examination without abnormal findings: Secondary | ICD-10-CM

## 2023-12-03 NOTE — Patient Instructions (Signed)
 Health Maintenance, Male  Adopting a healthy lifestyle and getting preventive care are important in promoting health and wellness. Ask your health care provider about:  The right schedule for you to have regular tests and exams.  Things you can do on your own to prevent diseases and keep yourself healthy.  What should I know about diet, weight, and exercise?  Eat a healthy diet    Eat a diet that includes plenty of vegetables, fruits, low-fat dairy products, and lean protein.  Do not eat a lot of foods that are high in solid fats, added sugars, or sodium.  Maintain a healthy weight  Body mass index (BMI) is a measurement that can be used to identify possible weight problems. It estimates body fat based on height and weight. Your health care provider can help determine your BMI and help you achieve or maintain a healthy weight.  Get regular exercise  Get regular exercise. This is one of the most important things you can do for your health. Most adults should:  Exercise for at least 150 minutes each week. The exercise should increase your heart rate and make you sweat (moderate-intensity exercise).  Do strengthening exercises at least twice a week. This is in addition to the moderate-intensity exercise.  Spend less time sitting. Even light physical activity can be beneficial.  Watch cholesterol and blood lipids  Have your blood tested for lipids and cholesterol at 51 years of age, then have this test every 5 years.  You may need to have your cholesterol levels checked more often if:  Your lipid or cholesterol levels are high.  You are older than 51 years of age.  You are at high risk for heart disease.  What should I know about cancer screening?  Many types of cancers can be detected early and may often be prevented. Depending on your health history and family history, you may need to have cancer screening at various ages. This may include screening for:  Colorectal cancer.  Prostate cancer.  Skin cancer.  Lung  cancer.  What should I know about heart disease, diabetes, and high blood pressure?  Blood pressure and heart disease  High blood pressure causes heart disease and increases the risk of stroke. This is more likely to develop in people who have high blood pressure readings or are overweight.  Talk with your health care provider about your target blood pressure readings.  Have your blood pressure checked:  Every 3-5 years if you are 24-52 years of age.  Every year if you are 3 years old or older.  If you are between the ages of 60 and 72 and are a current or former smoker, ask your health care provider if you should have a one-time screening for abdominal aortic aneurysm (AAA).  Diabetes  Have regular diabetes screenings. This checks your fasting blood sugar level. Have the screening done:  Once every three years after age 66 if you are at a normal weight and have a low risk for diabetes.  More often and at a younger age if you are overweight or have a high risk for diabetes.  What should I know about preventing infection?  Hepatitis B  If you have a higher risk for hepatitis B, you should be screened for this virus. Talk with your health care provider to find out if you are at risk for hepatitis B infection.  Hepatitis C  Blood testing is recommended for:  Everyone born from [birth year] through 1963.  Anyone  with known risk factors for hepatitis C.  Sexually transmitted infections (STIs)  You should be screened each year for STIs, including gonorrhea and chlamydia, if:  You are sexually active and are younger than 51 years of age.  You are older than 51 years of age and your health care provider tells you that you are at risk for this type of infection.  Your sexual activity has changed since you were last screened, and you are at increased risk for chlamydia or gonorrhea. Ask your health care provider if you are at risk.  Ask your health care provider about whether you are at high risk for HIV. Your health care provider  may recommend a prescription medicine to help prevent HIV infection. If you choose to take medicine to prevent HIV, you should first get tested for HIV. You should then be tested every 3 months for as long as you are taking the medicine.  Follow these instructions at home:  Alcohol use  Do not drink alcohol if your health care provider tells you not to drink.  If you drink alcohol:  Limit how much you have to 0-2 drinks a day.  Know how much alcohol is in your drink. In the [place] one drink equals one 12 oz bottle of beer (355 mL), one 5 oz glass of wine (148 mL), or one 1 oz glass of hard liquor (44 mL).  Lifestyle  Do not use any products that contain nicotine or tobacco. These products include cigarettes, chewing tobacco, and vaping devices, such as e-cigarettes. If you need help quitting, ask your health care provider.  Do not use street drugs.  Do not share needles.  Ask your health care provider for help if you need support or information about quitting drugs.  General instructions  Schedule regular health, dental, and eye exams.  Stay current with your vaccines.  Tell your health care provider if:  You often feel depressed.  You have ever been abused or do not feel safe at home.  Summary  Adopting a healthy lifestyle and getting preventive care are important in promoting health and wellness.  Follow your health care provider's instructions about healthy diet, exercising, and getting tested or screened for diseases.  Follow your health care provider's instructions on monitoring your cholesterol and blood pressure.  This information is not intended to replace advice given to you by your health care provider. Make sure you discuss any questions you have with your health care provider.  Document Revised: 08/06/2020 Document Reviewed: 08/06/2020  Elsevier Patient Education  2024 [employer].

## 2023-12-03 NOTE — Progress Notes (Signed)
 Office Note 12/03/2023  CC:  Chief Complaint  Patient presents with   Annual Exam    Pt is fasting   HPI:  Patient is a 53 y.o. male who is here for annual health maintenance exam. Sidra is feeling well.  Has noted a small skin lesion on the right side of his forehead for the last 3 months or so.  It occasionally will bleed a little bit. He does have a history of basal cell carcinoma on his face as well as melanoma on his back.   Past Medical History:  Diagnosis Date   Allergy    seasonal   Basal cell carcinoma 10/12/2019   Cancer of multiple primary sites [facility])    genetics referral by gen surg 01/17/20   Complication of anesthesia    Can not have 100% oxygen due to previous chemo tx   Family history of basal cell carcinoma    Family history of colon cancer    Family history of kidney cancer    Family history of prostate cancer in father    Hay fever    History of colon polyps    multiple   Inguinal hernia    Right   Melanoma (HCC) 2014   on his back.  Also R thigh with dysplastic nevus with moderate atypia 02/2016.  He sees Derm (Dr. Ivin) q 6 mo: as of 08/2017 derm f/u all good.   Rectal adenocarcinoma (HCC) 01/2020   surgery 02/09/20, no adjuvant therapy.   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    Past Surgical History:  Procedure Laterality Date   APPENDECTOMY  1990   COLONOSCOPY  12/2019   Most recent 06/06/21 multiple polyps, recall 2-3 yrs   FLEXIBLE SIGMOIDOSCOPY  09/12/2020   3 polyps; recall 02/2021 for full colonoscopy d/t high burden of polyps on colonoscopy 12/2019.   INGUINAL HERNIA REPAIR Right    MELANOMA EXCISION     with lymph nodes   MOHS SURGERY     ORCHIECTOMY Left 2003   RECTAL EXAM UNDER ANESTHESIA N/A 02/09/2020   Procedure: RECTAL EXAM UNDER ANESTHESIA;  Surgeon: Teresa Lonni HERO, MD;  Location: [facility];  Service: General;  Laterality: N/A;    SURGERY SCROTAL / TESTICULAR     had hernia repair at the same   TRANSANAL EXCISION OF RECTAL MASS N/A 02/09/2020   Procedure: anorectal exam under anesthesia  excision left anorectal polpoid lesion;  Surgeon: Teresa Lonni HERO, MD;  Location: [facility];  Service: General;  Laterality: N/A;    Family History  Problem Relation Age of Onset   Basal cell carcinoma Mother        several   Prostate cancer Father        dx 55s   Hyperlipidemia Father    Hypertension Father    Colon polyps Father 52       unknown number   Kidney cancer Father        dx 16s   Colon cancer Paternal Aunt        dx in early 70's   Heart disease Maternal Grandfather    Stroke Maternal Grandfather    Colon cancer Paternal Grandmother        Dx in her 37's   Colon polyps Brother        <10   Alzheimer's disease Paternal Grandfather    Esophageal cancer Neg Hx  Rectal cancer Neg Hx    Stomach cancer Neg Hx     Social History   Socioeconomic History   Marital status: Married    Spouse name: Not on file   Number of children: Not on file   Years of education: Not on file   Highest education level: Bachelor's degree (e.g., BA, AB, BS)  Occupational History   Not on file  Tobacco Use   Smoking status: Former    Current packs/day: 0.00    Average packs/day: 0.3 packs/day for 10.0 years (2.5 ttl pk-yrs)    Types: Cigarettes    Start date: 04/01/1991    Quit date: 03/31/2001    Years since quitting: 22.6    Passive exposure: Never   Smokeless tobacco: Never  Vaping Use   Vaping status: Never Used  Substance and Sexual Activity   Alcohol use: Yes    Alcohol/week: 6.0 standard drinks of alcohol    Types: 6 Cans of beer per week    Comment: 2 x a week   Drug use: No   Sexual activity: Not on file  Other Topics Concern   Not on file  Social History Narrative   Married, no children.   Orig from [place] .   Educ: BS   Occup: Banking   Minimal tob use in the past--quit 2003.   Alc: rare.     Social Drivers of Corporate investment banker Strain: Low Risk  (12/02/2023)   Overall Financial Resource Strain (CARDIA)    Difficulty of Paying Living Expenses: Not hard at all  Food Insecurity: No Food Insecurity (12/02/2023)   Hunger Vital Sign    Worried About Running Out of Food in the Last Year: Never true    Ran Out of Food in the Last Year: Never true  Transportation Needs: No Transportation Needs (12/02/2023)   PRAPARE - Administrator, Civil Service (Medical): No    Lack of Transportation (Non-Medical): No  Physical Activity: Sufficiently Active (12/02/2023)   Exercise Vital Sign    Days of Exercise per Week: 6 days    Minutes of Exercise per Session: 30 min  Stress: No Stress Concern Present (12/02/2023)   [employer] of Occupational Health - Occupational Stress Questionnaire    Feeling of Stress: Only a little  Social Connections: Unknown (12/02/2023)   Social Connection and Isolation Panel    Frequency of Communication with Friends and Family: Three times a week    Frequency of Social Gatherings with Friends and Family: Three times a week    Attends Religious Services: 1 to 4 times per year    Active Member of Clubs or Organizations: Patient declined    Attends Banker Meetings: Not on file    Marital Status: Married  Catering manager Violence: Not on file    Outpatient Medications Prior to Visit  Medication Sig Dispense Refill   hydrocortisone 2.5 % cream Apply 2.5 Applications topically daily as needed.     psyllium (METAMUCIL) 58.6 % packet Take 1 packet by mouth daily.     EPINEPHrine  (EPIPEN  2-PAK) 0.3 mg/0.3 mL IJ SOAJ injection Inject 0.3 mg into the muscle as needed for anaphylaxis. (Patient not taking: Reported on 12/03/2023) 2 each 1   Na Sulfate-K Sulfate-Mg Sulfate concentrate (SUPREP) 17.5-3.13-1.6 GM/177ML SOLN as directed. (Patient not taking: Reported on 12/03/2023)     No facility-administered medications prior to visit.    No  Known Allergies  Review of Systems  Constitutional:  Negative  for appetite change, chills, fatigue and fever.  HENT:  Negative for congestion, dental problem, ear pain and sore throat.   Eyes:  Negative for discharge, redness and visual disturbance.  Respiratory:  Negative for cough, chest tightness, shortness of breath and wheezing.   Cardiovascular:  Negative for chest pain, palpitations and leg swelling.  Gastrointestinal:  Negative for abdominal pain, blood in stool, diarrhea, nausea and vomiting.  Genitourinary:  Negative for difficulty urinating, dysuria, flank pain, frequency, hematuria and urgency.  Musculoskeletal:  Negative for arthralgias, back pain, joint swelling, myalgias and neck stiffness.  Skin:  Negative for pallor and rash.  Neurological:  Negative for dizziness, speech difficulty, weakness and headaches.  Hematological:  Negative for adenopathy. Does not bruise/bleed easily.  Psychiatric/Behavioral:  Negative for confusion and sleep disturbance. The patient is not nervous/anxious.     PE;    12/03/2023    8:57 AM 11/17/2023    9:25 AM 11/17/2023    9:15 AM  Vitals with BMI  Height 5' 11    Weight 196 lbs 13 oz    BMI 27.46    Systolic 118 105 899  Diastolic 72 66 62  Pulse 53 40 39   Gen: Alert, well appearing.  Patient is oriented to person, place, time, and situation. AFFECT: pleasant, lucid thought and speech. ENT: Ears: EACs clear, normal epithelium.  TMs with good light reflex and landmarks bilaterally.  Eyes: no injection, icteris, swelling, or exudate.  EOMI, PERRLA. Nose: no drainage or turbinate edema/swelling.  No injection or focal lesion.  Mouth: lips without lesion/swelling.  Oral mucosa pink and moist.  Dentition intact and without obvious caries or gingival swelling.  Oropharynx without erythema, exudate, or swelling.  Neck: supple/nontender.  No LAD, mass, or TM.  Carotid pulses 2+ bilaterally, without bruits. CV: RRR, no m/r/g.   LUNGS: CTA  bilat, nonlabored resps, good aeration in all lung fields. ABD: soft, NT, ND, BS normal.  No hepatospenomegaly or mass.  No bruits. EXT: no clubbing, cyanosis, or edema.  Musculoskeletal: no joint swelling, erythema, warmth, or tenderness.  ROM of all joints intact. Skin - no sores or suspicious lesions or rashes or color changes Right side of forehead with about a 1 mm pink papule with superficial ulceration.  No surrounding erythema.  Pertinent labs:  Lab Results  Component Value Date   TSH 1.93 11/04/2022   Lab Results  Component Value Date   WBC 4.9 11/04/2022   HGB 14.0 11/04/2022   HCT 42.1 11/04/2022   MCV 87.5 11/04/2022   PLT 254 11/04/2022   Lab Results  Component Value Date   CREATININE 0.94 11/04/2022   BUN 13 11/04/2022   NA 139 11/04/2022   K 4.3 11/04/2022   CL 104 11/04/2022   CO2 25 11/04/2022   Lab Results  Component Value Date   ALT 16 11/04/2022   AST 20 11/04/2022   ALKPHOS 58 10/30/2021   BILITOT 0.5 11/04/2022   Lab Results  Component Value Date   CHOL 207 (H) 11/04/2022   Lab Results  Component Value Date   HDL 82 11/04/2022   Lab Results  Component Value Date   LDLCALC 109 (H) 11/04/2022   Lab Results  Component Value Date   TRIG 75 11/04/2022   Lab Results  Component Value Date   CHOLHDL 2.5 11/04/2022   Lab Results  Component Value Date   PSA 0.89 11/04/2022   PSA 1.03 10/30/2021   PSA 1.01 10/26/2020   ASSESSMENT AND  PLAN:   #1 health maintenance exam: Reviewed age and gender appropriate health maintenance issues (prudent diet, regular exercise, health risks of tobacco and excessive alcohol, use of seatbelts, fire alarms in home, use of sunscreen).  Also reviewed age and gender appropriate health screening as well as vaccine recommendations. Vaccines: Shingrix--> declined.  Prevnar 20-->declined.  Flu->declined.  Otherwise up-to-date. Labs: fasting HP + PSA. Prostate ca screening: PSA today. Colon ca screening: pt  followed closely by GI and general surgery for his recent hx of rectal adenocarcinoma back in 2021.  Most recent colonoscopy was 11/17/23 and this showed multiple polyps that were adenomatous, recall in 2 years. Skin ca screening: he gets quite frequent derm eval's d/t hx of melanoma and BCC.  #2 skin lesion on forehead.   He has a personal history of basal cell carcinoma and melanoma. I encouraged him to make a follow-up appointment with his dermatologist to have this lesion further evaluated.  An After Visit Summary was printed and given to the patient.  FOLLOW UP:  Return in about 1 year (around 12/02/2024) for annual CPE (fasting).  Signed:  Gerlene Hockey, MD           12/03/2023

## 2023-12-04 ENCOUNTER — Ambulatory Visit: Payer: Self-pay | Admitting: Family Medicine

## 2023-12-04 LAB — COMPREHENSIVE METABOLIC PANEL WITH GFR
AG Ratio: 2 (calc) (ref 1.0–2.5)
ALT: 20 U/L (ref 9–46)
AST: 22 U/L (ref 10–35)
Albumin: 4.6 g/dL (ref 3.6–5.1)
Alkaline phosphatase (APISO): 59 U/L (ref 35–144)
BUN: 13 mg/dL (ref 7–25)
CO2: 29 mmol/L (ref 20–32)
Calcium: 9.6 mg/dL (ref 8.6–10.3)
Chloride: 105 mmol/L (ref 98–110)
Creat: 0.93 mg/dL (ref 0.70–1.30)
Globulin: 2.3 g/dL (ref 1.9–3.7)
Glucose, Bld: 92 mg/dL (ref 65–99)
Potassium: 4.3 mmol/L (ref 3.5–5.3)
Sodium: 140 mmol/L (ref 135–146)
Total Bilirubin: 0.7 mg/dL (ref 0.2–1.2)
Total Protein: 6.9 g/dL (ref 6.1–8.1)
eGFR: 99 mL/min/1.73m2 (ref >=60)

## 2023-12-04 LAB — CBC WITH DIFFERENTIAL/PLATELET
Absolute Lymphocytes: 1899 {cells}/uL (ref 850–3900)
Absolute Monocytes: 381 {cells}/uL (ref 200–950)
Basophils Absolute: 52 {cells}/uL (ref 0–200)
Basophils Relative: 1.1 %
Eosinophils Absolute: 113 {cells}/uL (ref 15–500)
Eosinophils Relative: 2.4 %
HCT: 44.8 % (ref 38.5–50.0)
Hemoglobin: 14.4 g/dL (ref 13.2–17.1)
MCH: 28.7 pg (ref 27.0–33.0)
MCHC: 32.1 g/dL (ref 32.0–36.0)
MCV: 89.2 fL (ref 80.0–100.0)
MPV: 10.7 fL (ref 7.5–12.5)
Monocytes Relative: 8.1 %
Neutro Abs: 2256 {cells}/uL (ref 1500–7800)
Neutrophils Relative %: 48 %
Platelets: 256 Thousand/uL (ref 140–400)
RBC: 5.02 Million/uL (ref 4.20–5.80)
RDW: 14.4 % (ref 11.0–15.0)
Total Lymphocyte: 40.4 %
WBC: 4.7 Thousand/uL (ref 3.8–10.8)

## 2023-12-04 LAB — PSA: PSA: 1.02 ng/mL (ref <=4.00)

## 2023-12-04 LAB — LIPID PANEL
Cholesterol: 228 mg/dL — ABNORMAL HIGH (ref <200)
HDL: 71 mg/dL (ref >=40)
LDL Cholesterol (Calc): 138 mg/dL — ABNORMAL HIGH
Non-HDL Cholesterol (Calc): 157 mg/dL — ABNORMAL HIGH (ref <130)
Total CHOL/HDL Ratio: 3.2 (calc) (ref <5.0)
Triglycerides: 89 mg/dL (ref <150)

## 2023-12-04 LAB — TSH: TSH: 1.59 m[IU]/L (ref 0.40–4.50)

## 2023-12-10 ENCOUNTER — Encounter: Payer: Self-pay | Admitting: Gastroenterology

## 2023-12-23 ENCOUNTER — Ambulatory Visit: Admitting: Gastroenterology

## 2023-12-23 ENCOUNTER — Encounter: Payer: Self-pay | Admitting: Gastroenterology

## 2023-12-23 VITALS — BP 118/65 | HR 44 | Temp 97.4°F | Resp 10 | Ht 70.0 in | Wt 190.0 lb

## 2023-12-23 DIAGNOSIS — Z1509 Genetic susceptibility to other malignant neoplasm: Secondary | ICD-10-CM | POA: Diagnosis not present

## 2023-12-23 DIAGNOSIS — K2100 Gastro-esophageal reflux disease with esophagitis, without bleeding: Secondary | ICD-10-CM

## 2023-12-23 DIAGNOSIS — K2950 Unspecified chronic gastritis without bleeding: Secondary | ICD-10-CM | POA: Diagnosis not present

## 2023-12-23 DIAGNOSIS — K449 Diaphragmatic hernia without obstruction or gangrene: Secondary | ICD-10-CM

## 2023-12-23 DIAGNOSIS — K2289 Other specified disease of esophagus: Secondary | ICD-10-CM | POA: Diagnosis not present

## 2023-12-23 MED ORDER — OMEPRAZOLE 40 MG PO CPDR: 40.0000 mg | DELAYED_RELEASE_CAPSULE | Freq: Every day | ORAL | 3 refills | Status: AC

## 2023-12-23 MED ORDER — SODIUM CHLORIDE 0.9 % IV SOLN: 500.0000 mL | Freq: Once | INTRAVENOUS | Status: DC

## 2023-12-23 NOTE — Progress Notes (Signed)
 Jay Ward History and Physical   Primary Care Physician:  Candise Aleene DEL, MD   Reason for Procedure:   Positive MSH6 gene   Plan:    EGD     HPI: Jay Ward is a 53 y.o. male  here for EGD - screening - positive MSH6 gene mutation, history of colon cancer. Otherwise feels well without any cardiopulmonary symptoms.     I have discussed risks / benefits of anesthesia and endoscopic procedure with Fonda Mulders and they wish to proceed with the exams as outlined today.    Past Medical History:  Diagnosis Date   Allergy    seasonal   Basal cell carcinoma 10/12/2019   Cancer of multiple primary sites [facility])    genetics referral by gen surg 01/17/20   Complication of anesthesia    Can not have 100% oxygen due to previous chemo tx   Family history of basal cell carcinoma    Family history of colon cancer    Family history of kidney cancer    Family history of prostate cancer in father    Hay fever    History of colon polyps    multiple   Inguinal hernia    Right   Melanoma (HCC) 2014   on his back.  Also R thigh with dysplastic nevus with moderate atypia 02/2016.  He sees Derm (Dr. Ivin) q 6 mo: as of 08/2017 derm f/u all good.   Rectal adenocarcinoma (HCC) 01/2020   surgery 02/09/20, no adjuvant therapy.   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    Past Surgical History:  Procedure Laterality Date   APPENDECTOMY  1990   COLONOSCOPY  12/2019   Most recent 06/06/21 multiple polyps, recall 2-3 yrs   FLEXIBLE SIGMOIDOSCOPY  09/12/2020   3 polyps; recall 02/2021 for full colonoscopy d/t high burden of polyps on colonoscopy 12/2019.   INGUINAL HERNIA REPAIR Right    MELANOMA EXCISION     with lymph nodes   MOHS SURGERY     ORCHIECTOMY Left 2003   RECTAL EXAM UNDER ANESTHESIA N/A 02/09/2020   Procedure: RECTAL EXAM UNDER ANESTHESIA;  Surgeon: Teresa Lonni HERO, MD;  Location: [facility];  Service: General;  Laterality: N/A;   SURGERY SCROTAL / TESTICULAR     had hernia repair at the same   TRANSANAL EXCISION OF RECTAL MASS N/A 02/09/2020   Procedure: anorectal exam under anesthesia  excision left anorectal polpoid lesion;  Surgeon: Teresa Lonni HERO, MD;  Location: [facility];  Service: General;  Laterality: N/A;    Prior to Admission medications   Medication Sig Start Date End Date Taking? Authorizing Provider  psyllium (METAMUCIL) 58.6 % packet Take 1 packet by mouth daily.   Yes [provider]  EPINEPHrine  (EPIPEN  2-PAK) 0.3 mg/0.3 mL IJ SOAJ injection Inject 0.3 mg into the muscle as needed for anaphylaxis. Patient not taking: Reported on 11/17/2023 09/27/21   McGowen, Philip H, MD  hydrocortisone 2.5 % cream Apply 2.5 Applications topically daily as needed. 05/13/23   [provider]    Current Outpatient Medications  Medication Sig Dispense Refill   psyllium (METAMUCIL) 58.6 % packet Take 1 packet by mouth daily.     EPINEPHrine  (EPIPEN  2-PAK) 0.3 mg/0.3 mL IJ SOAJ injection Inject 0.3 mg into the muscle as needed for anaphylaxis. (Patient not taking: Reported on 11/17/2023) 2 each 1   hydrocortisone 2.5 %  cream Apply 2.5 Applications topically daily as needed.     Current Facility-Administered Medications  Medication Dose Route Frequency Provider Last Rate Last Admin   0.9 %  sodium chloride  infusion  500 mL Intravenous Once Teo Moede, Elspeth SQUIBB, MD        Allergies as of 12/23/2023   (No Known Allergies)    Family History  Problem Relation Age of Onset   Basal cell carcinoma Mother        several   Prostate cancer Father        dx 30s   Hyperlipidemia Father    Hypertension Father    Colon polyps Father 26       unknown number   Kidney cancer Father        dx 20s   Colon cancer Paternal Aunt        dx in early 70's   Heart disease Maternal Grandfather    Stroke Maternal Grandfather    Colon  cancer Paternal Grandmother        Dx in her 66's   Colon polyps Brother        <10   Alzheimer's disease Paternal Grandfather    Esophageal cancer Neg Hx    Rectal cancer Neg Hx    Stomach cancer Neg Hx     Social History   Socioeconomic History   Marital status: Married    Spouse name: Not on file   Number of children: Not on file   Years of education: Not on file   Highest education level: Bachelor's degree (e.g., BA, AB, BS)  Occupational History   Not on file  Tobacco Use   Smoking status: Former    Current packs/day: 0.00    Average packs/day: 0.3 packs/day for 10.0 years (2.5 ttl pk-yrs)    Types: Cigarettes    Start date: 04/01/1991    Quit date: 03/31/2001    Years since quitting: 22.7    Passive exposure: Never   Smokeless tobacco: Never  Vaping Use   Vaping status: Never Used  Substance and Sexual Activity   Alcohol use: Yes    Alcohol/week: 6.0 standard drinks of alcohol    Types: 6 Cans of beer per week    Comment: 2 x a week   Drug use: No   Sexual activity: Not on file  Other Topics Concern   Not on file  Social History Narrative   Married, no children.   Orig from [place] .   Educ: BS   Occup: Banking   Minimal tob use in the past--quit 2003.   Alc: rare.    Social Drivers of Corporate investment banker Strain: Low Risk  (12/02/2023)   Overall Financial Resource Strain (CARDIA)    Difficulty of Paying Living Expenses: Not hard at all  Food Insecurity: No Food Insecurity (12/02/2023)   Hunger Vital Sign    Worried About Running Out of Food in the Last Year: Never true    Ran Out of Food in the Last Year: Never true  Transportation Needs: No Transportation Needs (12/02/2023)   PRAPARE - Administrator, Civil Service (Medical): No    Lack of Transportation (Non-Medical): No  Physical Activity: Sufficiently Active (12/02/2023)   Exercise Vital Sign    Days of Exercise per Week: 6 days    Minutes of Exercise per Session: 30 min  Stress: No  Stress Concern Present (12/02/2023)   [employer] of Occupational Health - Occupational Stress Questionnaire  Feeling of Stress: Only a little  Social Connections: Unknown (12/02/2023)   Social Connection and Isolation Panel    Frequency of Communication with Friends and Family: Three times a week    Frequency of Social Gatherings with Friends and Family: Three times a week    Attends Religious Services: 1 to 4 times per year    Active Member of Clubs or Organizations: Patient declined    Attends Banker Meetings: Not on file    Marital Status: Married  Catering manager Violence: Not on file    Review of Systems: All other review of systems negative except as mentioned in the HPI.  Physical Exam: Vital signs BP 122/78   Pulse (!) 48   Temp (!) 97.4 F (36.3 C) (Temporal)   Ht 5' 10 (1.778 m)   Wt 190 lb (86.2 kg)   SpO2 100%   BMI 27.26 kg/m   General:   Alert,  Well-developed, pleasant and cooperative in NAD Lungs:  Clear throughout to auscultation.   Heart:  Regular rate and rhythm Abdomen:  Soft, nontender and nondistended.   Neuro/Psych:  Alert and cooperative. Normal mood and affect. A and O x 3  Marcey Naval, MD [facility]

## 2023-12-23 NOTE — Progress Notes (Signed)
 Called to room to assist during endoscopic procedure.  Patient ID and intended procedure confirmed with present staff. Received instructions for my participation in the procedure from the performing physician.

## 2023-12-23 NOTE — Patient Instructions (Signed)
 Please read handouts provided. Continue present medications. Resume previous diet. Await pathology results. Start Omeprazole  40 mg daily for 6 weeks, then as needed.   YOU HAD AN ENDOSCOPIC PROCEDURE TODAY AT THE [facility]:   Refer to the procedure report that was given to you for any specific questions about what was found during the examination.  If the procedure report does not answer your questions, please call your gastroenterologist to clarify.  If you requested that your care partner not be given the details of your procedure findings, then the procedure report has been included in a sealed envelope for you to review at your convenience later.  YOU SHOULD EXPECT: Some feelings of bloating in the abdomen. Passage of more gas than usual.  Walking can help get rid of the air that was put into your GI tract during the procedure and reduce the bloating. If you had a lower endoscopy (such as a colonoscopy or flexible sigmoidoscopy) you may notice spotting of blood in your stool or on the toilet paper. If you underwent a bowel prep for your procedure, you may not have a normal bowel movement for a few days.  Please Note:  You might notice some irritation and congestion in your nose or some drainage.  This is from the oxygen used during your procedure.  There is no need for concern and it should clear up in a day or so.  SYMPTOMS TO REPORT IMMEDIATELY:  Following upper endoscopy (EGD)  Vomiting of blood or coffee ground material  New chest pain or pain under the shoulder blades  Painful or persistently difficult swallowing  New shortness of breath  Fever of 100F or higher  Black, tarry-looking stools  For urgent or emergent issues, a gastroenterologist can be reached at any hour by calling [phone]. Do not use MyChart messaging for urgent concerns.    DIET:  We do recommend a small meal at first, but then you may proceed to your regular diet.  Drink plenty of fluids  but you should avoid alcoholic beverages for 24 hours.  ACTIVITY:  You should plan to take it easy for the rest of today and you should NOT DRIVE or use heavy machinery until tomorrow (because of the sedation medicines used during the test).    FOLLOW UP: Our staff will call the number listed on your records the next business day following your procedure.  We will call around 7:15- 8:00 am to check on you and address any questions or concerns that you may have regarding the information given to you following your procedure. If we do not reach you, we will leave a message.     If any biopsies were taken you will be contacted by phone or by letter within the next 1-3 weeks.  Please call us  at [phone] if you have not heard about the biopsies in 3 weeks.    SIGNATURES/CONFIDENTIALITY: You and/or your care partner have signed paperwork which will be entered into your electronic medical record.  These signatures attest to the fact that that the information above on your After Visit Summary has been reviewed and is understood.  Full responsibility of the confidentiality of this discharge information lies with you and/or your care-partner.

## 2023-12-23 NOTE — Op Note (Signed)
 [facility] Patient Name: Jay Ward Procedure Date: 12/23/2023 8:27 AM MRN: [id number] Endoscopist: Elspeth P. 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 Patient                            denies reflux. Medicines:                Monitored Anesthesia Care Procedure:                Pre-Anesthesia Assessment:                           - Prior to the procedure, a History and Physical                            was performed, and patient medications and                            allergies were reviewed. The patient's tolerance of                            previous anesthesia was also reviewed. The risks                            and benefits of the procedure and the sedation                            options and risks were discussed with the patient.                            All questions were answered, and informed consent                            was obtained. Prior Anticoagulants: The patient has                            taken no anticoagulant or antiplatelet agents. ASA                            Grade Assessment: II - A patient with mild systemic                            disease. After reviewing the risks and benefits,                            the patient was deemed in satisfactory condition to  undergo the procedure.                           After obtaining informed consent, the endoscope was                            passed under direct vision. Throughout the                            procedure, the patient's blood pressure, pulse, and                            oxygen saturations were monitored continuously. The                            GIF [id number] #[id number] was  introduced through the                            mouth, and advanced to the second part of duodenum.                            The upper GI endoscopy was accomplished without                            difficulty. The patient tolerated the procedure                            well. Scope In: Scope Out: Findings:                 Esophagogastric landmarks were identified: the                            Z-line was found at 41 cm, the gastroesophageal                            junction was found at 41 cm and the upper extent of                            the gastric folds was found at 43 cm from the                            incisors.                           A 2 cm hiatal hernia was present.                           Mucosal changes characterized by slight nodularity                            were found at the gastroesophageal junction.                            Suspect inflammatory in nature, related to reflux.  Biopsies were taken with a cold forceps for                            histology, rule out dysplasia.                           LA Grade C (one or more mucosal breaks continuous                            between tops of 2 or more mucosal folds, less than                            75% circumference) esophagitis was found 33 to 41                            cm from the incisors.                           The exam of the esophagus was otherwise normal.                           The entire examined stomach was normal. Biopsies                            were taken with a cold forceps for Helicobacter                            pylori testing.                           The examined duodenum was normal. Complications:            No immediate complications. Estimated blood loss:                            Minimal. Estimated Blood Loss:     Estimated blood loss was minimal. Impression:               - Esophagogastric landmarks identified.                            - 2 cm hiatal hernia.                           - Nodular mucosa at the GEJ, suspect inflammatory.                            Biopsied.                           - LA Grade C reflux esophagitis.                           - Normal stomach. Biopsied.                           - Normal  examined duodenum. Recommendation:           - Patient has a contact number available for                            emergencies. The signs and symptoms of potential                            delayed complications were discussed with the                            patient. Return to normal activities tomorrow.                            Written discharge instructions were provided to the                            patient.                           - Resume previous diet.                           - Continue present medications.                           - Start omeprazole  40mg  / day for 6 weeks to treat                            esophagitis, then use PRN                           - Await pathology results. Elspeth P. Leigh, MD 12/23/2023 8:50:49 AM This report has been signed electronically.

## 2023-12-23 NOTE — Progress Notes (Signed)
 Sedate, gd SR, tolerated procedure well, VSS, report to RN

## 2023-12-24 ENCOUNTER — Telehealth: Payer: Self-pay

## 2023-12-24 NOTE — Telephone Encounter (Signed)
No answer on follow up call. 

## 2023-12-25 ENCOUNTER — Encounter: Payer: Self-pay | Admitting: Gastroenterology

## 2023-12-28 LAB — SURGICAL PATHOLOGY

## 2023-12-29 ENCOUNTER — Ambulatory Visit: Payer: Self-pay | Admitting: Gastroenterology

## 2024-12-20 ENCOUNTER — Encounter: Admitting: Family Medicine
